# Patient Record
Sex: Female | Born: 1940 | Race: White | Hispanic: No | Marital: Married | Smoking: Never smoker
Health system: Southern US, Community
[De-identification: ages and names within clinical notes are randomized; demographics above are authoritative.]

## PROBLEM LIST (undated history)

## (undated) DIAGNOSIS — I10 Essential (primary) hypertension: Secondary | ICD-10-CM

## (undated) DIAGNOSIS — I499 Cardiac arrhythmia, unspecified: Secondary | ICD-10-CM

## (undated) DIAGNOSIS — E785 Hyperlipidemia, unspecified: Secondary | ICD-10-CM

## (undated) DIAGNOSIS — I509 Heart failure, unspecified: Secondary | ICD-10-CM

## (undated) DIAGNOSIS — I2510 Atherosclerotic heart disease of native coronary artery without angina pectoris: Secondary | ICD-10-CM

## (undated) HISTORY — PX: EYE SURGERY: SHX253

## (undated) HISTORY — PX: COLONOSCOPY: SHX174

## (undated) HISTORY — PX: ATRIAL FIBRILLATION ABLATION: EP1191

## (undated) HISTORY — PX: CHOLECYSTECTOMY: SHX55

---

## 2010-08-12 ENCOUNTER — Emergency Department (HOSPITAL_COMMUNITY)
Admission: EM | Admit: 2010-08-12 | Discharge: 2010-08-12 | Disposition: A | Discharge status: Home / Self Care | Payer: Medicare Other | Attending: Emergency Medicine | Admitting: Emergency Medicine

## 2010-08-12 ENCOUNTER — Emergency Department (HOSPITAL_COMMUNITY): Payer: Medicare Other

## 2010-08-12 DIAGNOSIS — F411 Generalized anxiety disorder: Secondary | ICD-10-CM | POA: Insufficient documentation

## 2010-08-12 DIAGNOSIS — I4891 Unspecified atrial fibrillation: Secondary | ICD-10-CM

## 2010-08-12 DIAGNOSIS — R0609 Other forms of dyspnea: Secondary | ICD-10-CM | POA: Insufficient documentation

## 2010-08-12 DIAGNOSIS — R002 Palpitations: Secondary | ICD-10-CM | POA: Insufficient documentation

## 2010-08-12 DIAGNOSIS — R0602 Shortness of breath: Secondary | ICD-10-CM | POA: Insufficient documentation

## 2010-08-12 DIAGNOSIS — R0989 Other specified symptoms and signs involving the circulatory and respiratory systems: Secondary | ICD-10-CM | POA: Insufficient documentation

## 2010-08-12 DIAGNOSIS — R000 Tachycardia, unspecified: Secondary | ICD-10-CM | POA: Insufficient documentation

## 2010-08-12 LAB — COMPREHENSIVE METABOLIC PANEL
Albumin: 3.7 g/dL (ref 3.5–5.2)
Alkaline Phosphatase: 108 U/L (ref 39–117)
BUN: 16 mg/dL (ref 6–23)
CO2: 26 mEq/L (ref 19–32)
Chloride: 106 mEq/L (ref 96–112)
GFR calc non Af Amer: >60 mL/min (ref >60)
Glucose, Bld: 111 mg/dL — ABNORMAL HIGH (ref 70–99)
Potassium: 3.7 mEq/L (ref 3.5–5.1)
Total Bilirubin: 0.6 mg/dL (ref 0.3–1.2)

## 2010-08-12 LAB — DIFFERENTIAL
Basophils Absolute: 0 10*3/uL (ref 0.0–0.1)
Eosinophils Absolute: 0.1 10*3/uL (ref 0.0–0.7)
Eosinophils Relative: 1 % (ref 0–5)
Lymphocytes Relative: 18 % (ref 12–46)
Neutrophils Relative %: 76 % (ref 43–77)

## 2010-08-12 LAB — CBC
Platelets: 246 10*3/uL (ref 150–400)
RBC: 4.75 MIL/uL (ref 3.87–5.11)
RDW: 13 % (ref 11.5–15.5)
WBC: 8.2 10*3/uL (ref 4.0–10.5)

## 2010-08-12 LAB — PROTIME-INR
INR: 1.02 (ref 0.00–1.49)
Prothrombin Time: 13.6 seconds (ref 11.6–15.2)

## 2010-08-12 LAB — T4, FREE: Free T4: 1.44 ng/dL (ref 0.80–1.80)

## 2010-08-12 LAB — APTT: aPTT: 31 seconds (ref 24–37)

## 2010-08-12 LAB — CK TOTAL AND CKMB (NOT AT ARMC): Relative Index: INVALID (ref 0.0–2.5)

## 2010-08-12 LAB — TSH: TSH: 1.14 u[IU]/mL (ref 0.350–4.500)

## 2010-09-21 NOTE — Consult Note (Signed)
[name], [name]            ACCOUNT NO.:  [id number]  MEDICAL RECORD NO.:  [id number]           PATIENT TYPE:  E  LOCATION:  [facility]                         FACILITY:  [facility]  PHYSICIAN:  Rollene Rotunda, MD, FACCDATE OF BIRTH:  05/07/1940  DATE OF CONSULTATION:  08/12/2010 DATE OF DISCHARGE:                                CONSULTATION   PRIMARY CARDIOLOGIST:  [facility], being seen by Dr. Antoine Blair.  PRIMARY CARE PROVIDER:  Wardell Honour, MD, located at [address] [place], [place], [address], phone number [phone].  PATIENT PROFILE:  A 70 year old female without prior cardiac history, presents to the [facility] ED with AFib and RVR.  PROBLEMS: 1. AFib with RVR. 2. History of palpitations. 3. History of the chest congestion and sinus congestion. 4. Status post cholecystectomy.  ALLERGIES:  No known drug allergies.  HISTORY OF PRESENT ILLNESS:  A 70 year old female without prior cardiac history.  In the early revealed April, she developed chest congestion and sinus congestion, was treated with antibiotics and Mucinex.  She notes that she was not taking any decongestants.  She did have symptomatic improvement, but then approximately 10 days ago noted recurrent chest congestion and also dyspnea on exertion, which was new, and she also says she has occasional palpitations.  She flew from [place], [place] to [place], [place], yesterday to visit her daughter in [place].  Because of recurrent worsening chest congestion, the patient started taking Mucinex again without decongestants.  This morning, secondary to progressive congestion in her chest, she went to a local urgent care where she was found to be in AFib with RVR with rates in the 170s.  She was taken to [facility] ED and treated with diltiazem bolus x2 followed by infusion at 10 mg per hour. This was subsequently increased to 20 mg per hour and the patient was also treated  with Lopressor 5 mg IV x1.  This resulted in reduction in blood pressure into the 80s.  At that point, the patient felt somewhat lightheaded, but also heart rate dropped into the 90s.  The patient received a fluid bolus with improvement in blood pressure and symptoms while heart rate remained in the 90s and subsequently converted spontaneously to sinus rhythm.  The patient is currently asymptomatic.  HOME MEDICATIONS:  None.  FAMILY HISTORY:  Mother died of colon cancer at 26.  Father died of an MI at 14.  Brother recently died of an MI at age 48.  SOCIAL HISTORY:  The patient lives in [place], [place], with her husband.  She works at a [employer].  She denies tobacco or drug use.  She had an occasional glass of wine and drinks 1 cup of coffee in the morning and has a cup of decaf at night.  She avoids caffeinated beverages otherwise.  REVIEW OF SYSTEMS:  Positive for occasional palpitations, though interestingly none today in the setting of a heart rate of 170.  She has been having chest congestion and also has noticed dyspnea on exertion. She is a full code.  Otherwise, all systems reviewed and negative.  PHYSICAL EXAMINATION:  VITAL SIGNS:  She is afebrile, heart rate 71, respirations 18, blood pressure 105/73, pulse ox 92% on room air. GENERAL:  Pleasant white female, in no acute distress, awake, alert and oriented x3.  She has a normal affect. HEENT:  Normal. NEUROLOGIC:  Grossly, nonfocal. SKIN:  Warm and dry without lesions or masses. NECK:  Supple without bruits, JVD. LUNGS:  Respirations are regular and unlabored with bibasilar crackles, otherwise, clear to auscultation. CARDIAC:  Regular S1 and S2.  No S3, S4, or murmurs. ABDOMEN:  Round, soft, nontender, nondistended.  Bowel sounds present x4. EXTREMITIES:  Warm, dry, pink.  No clubbing, cyanosis or edema. Dorsalis pedis and posterior tibial pulses 2+ and equal bilaterally.  Chest x-ray shows  borderline heart size, bilateral perihilar and lower lobe opacities, right greater than left.  Suspect small bilateral effusions.  EKG shows AFib, rate of 175, normal axis, no acute ST-T changes.  Followup EKG shows sinus rhythm with PAC, no acute ST or T changes.  Hemoglobin 14.3, hematocrit 43.2, WBC 8.2, platelets 246. Sodium 143, potassium 3.7, chloride 106, CO2 of 26, BUN 16, creatinine 0.69, glucose 111, total bilirubin 0.6, alkaline phosphatase 108, AST 51, ALT 97, total protein 7.1, albumin 9.5.  CK 66, MB 2.7, troponin-I less than 0.30.  INR 1.02, calcium 9.5.  ASSESSMENT AND PLAN:  Atrial fibrillation with rapid ventricular response.  The patient has now converted to sinus rhythm.  We plan to check a 2-D echocardiogram in the ED as she does have bibasilar crackles and some suggestion of vascular congestion on chest x-ray.  Provided that echo shows normal LV function, in the setting of a CHADS2 score of 0 (CHADS2-VASc of 2), we will plan to discharge the patient from the ED on aspirin 325 mg daily.  We will also give her a prescription for diltiazem 30 mg t.i.d. and recommend followup with her primary care provider in [place].  If echocardiogram shows new reduction in LV function, we will plan to admit the patient for further evaluation.     Nicolasa Ducking, ANP   ______________________________ Rollene Rotunda, MD, [facility]    CB/MEDQ  D:  08/12/2010  T:  08/13/2010  Job:  [id number]  cc:   Ulice Brilliant, MD Lauren Honour, MD  Electronically Signed by Nicolasa Ducking ANP on 08/17/2010 01:14:02 PM Electronically Signed by Rollene Rotunda MD [facility] on 09/21/2010 11:39:45 AM

## 2020-02-26 ENCOUNTER — Emergency Department (HOSPITAL_BASED_OUTPATIENT_CLINIC_OR_DEPARTMENT_OTHER): Payer: Medicare Other

## 2020-02-26 ENCOUNTER — Encounter (HOSPITAL_BASED_OUTPATIENT_CLINIC_OR_DEPARTMENT_OTHER): Payer: Self-pay | Admitting: *Deleted

## 2020-02-26 ENCOUNTER — Other Ambulatory Visit: Payer: Self-pay

## 2020-02-26 ENCOUNTER — Emergency Department (HOSPITAL_BASED_OUTPATIENT_CLINIC_OR_DEPARTMENT_OTHER)
Admission: EM | Admit: 2020-02-26 | Discharge: 2020-02-26 | Disposition: A | Discharge status: Home / Self Care | Payer: Medicare Other | Attending: Emergency Medicine | Admitting: Emergency Medicine

## 2020-02-26 DIAGNOSIS — I2510 Atherosclerotic heart disease of native coronary artery without angina pectoris: Secondary | ICD-10-CM | POA: Insufficient documentation

## 2020-02-26 DIAGNOSIS — S82842A Displaced bimalleolar fracture of left lower leg, initial encounter for closed fracture: Secondary | ICD-10-CM | POA: Diagnosis not present

## 2020-02-26 DIAGNOSIS — W108XXA Fall (on) (from) other stairs and steps, initial encounter: Secondary | ICD-10-CM | POA: Insufficient documentation

## 2020-02-26 DIAGNOSIS — Z7901 Long term (current) use of anticoagulants: Secondary | ICD-10-CM | POA: Insufficient documentation

## 2020-02-26 DIAGNOSIS — Z79899 Other long term (current) drug therapy: Secondary | ICD-10-CM | POA: Diagnosis not present

## 2020-02-26 DIAGNOSIS — R2242 Localized swelling, mass and lump, left lower limb: Secondary | ICD-10-CM | POA: Diagnosis not present

## 2020-02-26 DIAGNOSIS — S99912A Unspecified injury of left ankle, initial encounter: Secondary | ICD-10-CM | POA: Diagnosis present

## 2020-02-26 HISTORY — DX: Atherosclerotic heart disease of native coronary artery without angina pectoris: I25.10

## 2020-02-26 MED ORDER — HYDROCODONE-ACETAMINOPHEN 5-325 MG PO TABS: 1.0000 | ORAL_TABLET | Freq: Four times a day (QID) | ORAL | 0 refills | Status: DC | PRN

## 2020-02-26 NOTE — Discharge Instructions (Signed)
Please follow up with Dr. August Saucer on Monday (call the office first thing to schedule an appointment) to discuss surgery. It is recommended that you stop taking your Xarelto Saturday and Sunday in preparation for surgery Monday.   Do not bare weight onto your foot by any means. Whenever possible please elevate your foot on 2-3 pillows to help reduce swelling/blood pooling specifically given the fact that you are on a blood thinner. You can apply ice as needed. I have prescribed a short course of narcotic pain medication to take as needed however if your pain is tolerable Tylenol will work as well. I have sent the prescription to the pharmacy that [facility] has specified - please pick up tomorrow as they are not open today.   Return to the ED IMMEDIATELY for any worsening symptoms

## 2020-02-26 NOTE — ED Triage Notes (Signed)
Left ankle injury. She fell down steps this am. Swelling and deformity noted. To tx room via w/c.

## 2020-02-26 NOTE — ED Notes (Signed)
ED Provider at bedside. 

## 2020-02-26 NOTE — ED Notes (Signed)
Ice applied. Foot elevated.

## 2020-02-26 NOTE — ED Provider Notes (Signed)
[facility] EMERGENCY DEPARTMENT Provider Note   CSN: [id number] Arrival date & time: 02/26/20  1236     History Chief Complaint  Patient presents with  . Ankle Injury    [name] is a 79 y.o. female with PMHx CAD, and A fib on Xarelto who presents to the ED today with complaint of sudden onset, constant, sharp, left ankle pain secondary to falling down 2 steps in her garage earlier this morning.  Patient was on her way to [place] when she tripped and fell going down 3 steps total in her garage. She cleared the first step when she stumbled and fell; she states the landing at the bottom of the stairs is quite narrow and her left leg went down underneath her. No head injury or LOC. She was able to get up on her own however has not been able to bare weight onto the ankle causing her to have her son bring her to the ED for further evaluation. She took Tylenol PTA with relief of her pain. She states her ankle feels "tight" but otherwise is feeling okay. No other complaints at this time.   The history is provided by the patient and medical records.       Past Medical History:  Diagnosis Date  . Coronary artery disease     There are no problems to display for this patient.   Past Surgical History:  Procedure Laterality Date  . ATRIAL FIBRILLATION ABLATION       OB History   No obstetric history on file.     No family history on file.  Social History   Tobacco Use  . Smoking status: Never Smoker  . Smokeless tobacco: Never Used  Substance Use Topics  . Alcohol use: Yes  . Drug use: Never    Home Medications Prior to Admission medications   Medication Sig Start Date End Date Taking? Authorizing Provider  atorvastatin (LIPITOR) 10 MG tablet Take 10 mg by mouth daily. 12/10/19   [provider]  HYDROcodone-acetaminophen (NORCO/VICODIN) 5-325 MG tablet Take 1 tablet by mouth every 6 (six) hours as needed for severe pain. 02/26/20   Tanda Rockers, PA-C  sotalol (BETAPACE) 80 MG tablet Take 80 mg by mouth 2 (two) times daily. 01/17/20   [provider]  timolol (TIMOPTIC) 0.5 % ophthalmic solution  10/13/19   [provider]  XARELTO 20 MG TABS tablet Take 20 mg by mouth daily. 02/06/20   [provider]    Allergies    Patient has no known allergies.  Review of Systems   Review of Systems  Constitutional: Negative for chills and fever.  Musculoskeletal: Positive for arthralgias.  Skin: Negative for wound.  Neurological: Negative for syncope and headaches.  All other systems reviewed and are negative.   Physical Exam Updated Vital Signs BP 132/70   Pulse 63   Temp 97.7 F (36.5 C) (Oral)   Resp 18   Ht 5\' 4"  (1.626 m)   Wt 90.7 kg   SpO2 98%   BMI 34.33 kg/m   Physical Exam Vitals and nursing note reviewed.  Constitutional:      Appearance: She is not ill-appearing.  HENT:     Head: Normocephalic and atraumatic.     Comments: No raccoon's sign or battle's sign.  Eyes:     Conjunctiva/sclera: Conjunctivae normal.  Cardiovascular:     Rate and Rhythm: Normal rate and regular rhythm.     Pulses: Normal pulses.  Pulmonary:  Effort: Pulmonary effort is normal.     Breath sounds: Normal breath sounds. No wheezing, rhonchi or rales.  Abdominal:     Palpations: Abdomen is soft.     Tenderness: There is no abdominal tenderness.  Musculoskeletal:     Cervical back: Neck supple. No tenderness.     Comments: Pelvis is stable. No C, T, or L midline spinal TTP.   Left ankle with diffuse swelling circumferentially with mild TTP. ROM limited s/2 pain however pt able to flex and extend without much difficulty. No tenderness to the foot or proximal tib/fib. 2+ DP pulse. Cap refill < 2 seconds.   Skin:    General: Skin is warm and dry.  Neurological:     Mental Status: She is alert.     ED Results / Procedures / Treatments   Labs (all labs ordered are listed, but only abnormal  results are displayed) Labs Reviewed - No data to display  EKG None  Radiology DG Ankle Complete Left  Result Date: 02/26/2020 CLINICAL DATA:  Fall down steps EXAM: LEFT ANKLE COMPLETE - 3+ VIEW COMPARISON:  None. FINDINGS: Osteopenia. There is a bimalleolar fracture with an oblique fracture through the distal fibula with mild posterior displacement of the distal fragment by approximately 1 cortex width. There is a mildly displaced medial malleolar fracture with lateral translocation of the distal fragment by approximately 6 mm. Ankle mortise is disrupted with widening of the medial clear space and lateral tilt of the talar dome. There is associated soft tissue edema. IMPRESSION: Unstable bimalleolar fracture. Electronically Signed   By: Meda Klinefelter MD   On: 02/26/2020 13:22    Procedures Procedures (including critical care time)  Medications Ordered in ED Medications - No data to display  ED Course  I have reviewed the triage vital signs and the nursing notes.  Pertinent labs & imaging results that were available during my care of the patient were reviewed by me and considered in my medical decision making (see chart for details).  Clinical Course as of Feb 26 1651  Thu Feb 26, 2020  1449 Stop taking xarelto Saturday.  Well padded sugar tong posterior Splint ABD to both malleli and heal.  Elevate.  Non weight baring.    [MV]    Clinical Course User Index [MV] Tanda Rockers, PA-C   MDM Rules/Calculators/A&P                           79 year old female presenting to the ED today with complaint of sudden onset, sharp, left ankle pain s/2 missing 2 steps in her garage earlier today. No head injury or LOC. Has not been able to bare weight since prompting her to come to the ED. On arrival VSS. She had an xray done prior to being seen which does show an unstable bimalleolar fracture. On exam pt has diffuse swelling to the left ankle joint with mild TTP. She is NVI  throughout. Given the unstable aspect of the fracture will plan to discuss with ortho regarding patient. She is currently on Xarelto - last took dose last night. She last ate or drank around 8 AM this morning.   Discussed case with Dr. Johnell Comings who evaluated xray; recommends outpatient follow up next week with plan for surgery either Monday or Tuesday. Recommends posterior sugar tong splint with ABD padding to bilateral malleoli and heal. Stressed importance of elevation whenever possible and nonweight baring. Pt to stop  Xarelto Saturday night.   Attending physician Dr. Clarene Duke has evaluated patient as well and agrees with plan.   Splint has been applied successfully; evaluated by myself afterwards. Pt able to tolerate crutches well. Will discharge at this time.   This note was prepared using Dragon voice recognition software and may include unintentional dictation errors due to the inherent limitations of voice recognition software.  Final Clinical Impression(s) / ED Diagnoses Final diagnoses:  Closed bimalleolar fracture of left ankle, initial encounter    Rx / DC Orders ED Discharge Orders         Ordered    HYDROcodone-acetaminophen (NORCO/VICODIN) 5-325 MG tablet  Every 6 hours PRN        02/26/20 1540           Discharge Instructions     Please follow up with Dr. August Saucer on Monday (call the office first thing to schedule an appointment) to discuss surgery. It is recommended that you stop taking your Xarelto Saturday and Sunday in preparation for surgery Monday.   Do not bare weight onto your foot by any means. Whenever possible please elevate your foot on 2-3 pillows to help reduce swelling/blood pooling specifically given the fact that you are on a blood thinner. You can apply ice as needed. I have prescribed a short course of narcotic pain medication to take as needed however if your pain is tolerable Tylenol will work as well. I have sent the prescription to the pharmacy that [facility]  has specified - please pick up tomorrow as they are not open today.   Return to the ED IMMEDIATELY for any worsening symptoms       Tanda Rockers, PA-C 02/26/20 1653    Little, Ambrose Finland, MD 02/26/20 [phone]

## 2020-03-01 ENCOUNTER — Ambulatory Visit (INDEPENDENT_AMBULATORY_CARE_PROVIDER_SITE_OTHER): Payer: Medicare Other

## 2020-03-01 ENCOUNTER — Ambulatory Visit (INDEPENDENT_AMBULATORY_CARE_PROVIDER_SITE_OTHER): Payer: Medicare Other | Admitting: Orthopedic Surgery

## 2020-03-01 DIAGNOSIS — M25572 Pain in left ankle and joints of left foot: Secondary | ICD-10-CM

## 2020-03-03 ENCOUNTER — Telehealth: Payer: Self-pay

## 2020-03-03 NOTE — Telephone Encounter (Addendum)
Patient called stating that she has a black and blue bruise on the back of her left leg below her knee where the cast is.  Would like to know if this is normal?  Patient is on a blood thinner.  Cb# [phone].  Please advise.  Thank you.

## 2020-03-04 ENCOUNTER — Encounter: Payer: Self-pay | Admitting: Orthopedic Surgery

## 2020-03-04 NOTE — Telephone Encounter (Signed)
Can either of you please advise?  Patient seen Monday-has fracture blisters and was to follow up in 1 week Thanks.

## 2020-03-04 NOTE — Telephone Encounter (Signed)
Pt called stating nobody has returned her phone calls she also sent a Mychart message with a picture attached.  She would like to be contacted before the weekend.

## 2020-03-05 NOTE — Telephone Encounter (Signed)
Yes this is expected

## 2020-03-05 NOTE — Telephone Encounter (Signed)
Dr August Saucer spoke with patient/family.

## 2020-03-07 ENCOUNTER — Encounter: Payer: Self-pay | Admitting: Orthopedic Surgery

## 2020-03-07 NOTE — Progress Notes (Signed)
Office Visit Note   Patient: [name]           Date of Birth: [birth year]           MRN: [id number] Visit Date: 03/01/2020 Requested by: No referring provider defined for this encounter. PCP: System, Provider Not In  Subjective: Chief Complaint  Patient presents with  . Left Ankle - Injury    HPI: [name] is a 79 year old active female with left ankle pain.  She had a fall 2011 2521.  Had bimalleolar ankle fracture which was splinted.  She is on Xarelto.  Last dose was 48 hours ago.  She lives in [place] but she staying with her son for the holiday.  Denies any other orthopedic complaints.  Takes Xarelto for atrial fibrillation.              ROS: All systems reviewed are negative as they relate to the chief complaint within the history of present illness.  Patient denies  fevers or chills.   Assessment & Plan: Visit Diagnoses:  1. Pain in left ankle and joints of left foot     Plan: Impression is left ankle fracture with fracture blisters.  This fracture blisters are decompressed today.  Primarily on the lateral side but there is a little bit of skin abrasion on the medial side.  New splint is applied today with a little bit more of a medial directed force and mold on the cast to decompress that medial side.  Not really ready for surgery yet based on presence of these fracture blisters.  May be 1 to 2 weeks before were able to do this case.  New splint applied today.  Alignment remains reasonable.  Follow-up in 7 days for repeat evaluation of the skin.  Follow-Up Instructions: Return in about 1 week (around 03/08/2020).   Orders:  Orders Placed This Encounter  Procedures  . XR Ankle 2 Views Left   No orders of the defined types were placed in this encounter.     Procedures: No procedures performed   Clinical Data: No additional findings.  Objective: Vital Signs: There were no vitals taken for this visit.  Physical Exam:   Constitutional: Patient appears  well-developed HEENT:  Head: Normocephalic Eyes:EOM are normal Neck: Normal range of motion Cardiovascular: Normal rate Pulmonary/chest: Effort normal Neurologic: Patient is alert Skin: Skin is warm Psychiatric: Patient has normal mood and affect    Ortho Exam: Ortho exam demonstrates full active and passive range of motion of the knee.  Fracture blisters are present x3 over the lateral aspect of the lateral malleolus.  These are in line with a direct lateral approach to the fibula.  There is also some abrasion medially but no discrete fracture blisters around the medial malleolus.  Expected amount of swelling is present.  Pedal pulses palpable.  No calf tenderness.  Specialty Comments:  No specialty comments available.  Imaging: No results found.   PMFS History: There are no problems to display for this patient.  Past Medical History:  Diagnosis Date  . Coronary artery disease     No family history on file.  Past Surgical History:  Procedure Laterality Date  . ATRIAL FIBRILLATION ABLATION     Social History   Occupational History  . Not on file  Tobacco Use  . Smoking status: Never Smoker  . Smokeless tobacco: Never Used  Substance and Sexual Activity  . Alcohol use: Yes  . Drug use: Never  . Sexual activity: Not  on file

## 2020-03-08 ENCOUNTER — Ambulatory Visit (INDEPENDENT_AMBULATORY_CARE_PROVIDER_SITE_OTHER): Payer: Medicare Other | Admitting: Orthopedic Surgery

## 2020-03-08 ENCOUNTER — Other Ambulatory Visit: Payer: Self-pay

## 2020-03-08 VITALS — Ht 64.0 in | Wt 200.0 lb

## 2020-03-08 DIAGNOSIS — S82892G Other fracture of left lower leg, subsequent encounter for closed fracture with delayed healing: Secondary | ICD-10-CM | POA: Diagnosis not present

## 2020-03-10 ENCOUNTER — Other Ambulatory Visit: Payer: Self-pay

## 2020-03-12 ENCOUNTER — Encounter: Payer: Self-pay | Admitting: Orthopedic Surgery

## 2020-03-12 ENCOUNTER — Other Ambulatory Visit (HOSPITAL_COMMUNITY)
Admission: RE | Admit: 2020-03-12 | Discharge: 2020-03-12 | Disposition: A | Discharge status: Home / Self Care | Payer: Medicare Other | Source: Ambulatory Visit | Attending: Orthopedic Surgery | Admitting: Orthopedic Surgery

## 2020-03-12 DIAGNOSIS — Z01812 Encounter for preprocedural laboratory examination: Secondary | ICD-10-CM | POA: Insufficient documentation

## 2020-03-12 DIAGNOSIS — Z20822 Contact with and (suspected) exposure to covid-19: Secondary | ICD-10-CM | POA: Insufficient documentation

## 2020-03-12 LAB — SARS CORONAVIRUS 2 (TAT 6-24 HRS): SARS Coronavirus 2: NEGATIVE

## 2020-03-12 NOTE — Progress Notes (Signed)
Office Visit Note   Patient: [name]           Date of Birth: 04-30-1940           MRN: [id number] Visit Date: 03/08/2020 Requested by: No referring provider defined for this encounter. PCP: System, Provider Not In  Subjective: Chief Complaint  Patient presents with  . Left Ankle - Fracture, Follow-up    DOI 02/26/2020    HPI: [name] is a 79 year old patient with left ankle fracture.  Had fracture blisters.  Splint is removed today.  Fracture blisters are improving.  She is on Xarelto.  She require surgery for unstable bimalleolar ankle fracture.              ROS: All systems reviewed are negative as they relate to the chief complaint within the history of present illness.  Patient denies  fevers or chills.   Assessment & Plan: Visit Diagnoses:  1. Closed fracture of left ankle with delayed healing, subsequent encounter     Plan: Impression is closed ankle fracture.  Splint removed today.  Fracture blister healing is progressing.  I think should be ready in approximately 1 week for fixation.  We may have to go a little bit more posterior on the ankle fracture than usual.  Medial side should be fixable with 1 or 2 screws.  We are going to have her hold her Xarelto last dose Saturday night.  Plan for surgery on Tuesday.  Risk and benefits are discussed include not limited to infection nerve vessel damage ankle stiffness as well as deep vein thrombosis stroke heart attack and other perioperative events are also possible.  All questions answered.  Follow-Up Instructions: No follow-ups on file.   Orders:  No orders of the defined types were placed in this encounter.  No orders of the defined types were placed in this encounter.     Procedures: No procedures performed   Clinical Data: No additional findings.  Objective: Vital Signs: Ht 5\' 4"  (1.626 m)   Wt 200 lb (90.7 kg)   BMI 34.33 kg/m   Physical Exam:   Constitutional: Patient appears  well-developed HEENT:  Head: Normocephalic Eyes:EOM are normal Neck: Normal range of motion Cardiovascular: Normal rate Pulmonary/chest: Effort normal Neurologic: Patient is alert Skin: Skin is warm Psychiatric: Patient has normal mood and affect    Ortho Exam: Ortho exam on the medial side demonstrates skin that does not have a fracture blister.  There is some evidence of pressure from the fracture but no skin breakdown.  On the lateral side patient does have fracture blister which has been decompressed and is epithelializing.  This is about 2 fingerbreadths above the metaphysis of the fibular head.  There is room posterior to this fracture blister for approach to the ankle with possible posterior plating.  Pedal pulses palpable.  Negative Homans.  No calf tenderness today.  Specialty Comments:  No specialty comments available.  Imaging: No results found.   PMFS History: There are no problems to display for this patient.  Past Medical History:  Diagnosis Date  . Coronary artery disease     History reviewed. No pertinent family history.  Past Surgical History:  Procedure Laterality Date  . ATRIAL FIBRILLATION ABLATION     Social History   Occupational History  . Not on file  Tobacco Use  . Smoking status: Never Smoker  . Smokeless tobacco: Never Used  Substance and Sexual Activity  . Alcohol use: Yes  . Drug use:  Never  . Sexual activity: Not on file

## 2020-03-15 ENCOUNTER — Encounter (HOSPITAL_COMMUNITY): Payer: Self-pay | Admitting: Orthopedic Surgery

## 2020-03-15 NOTE — Anesthesia Preprocedure Evaluation (Addendum)
Anesthesia Evaluation  Patient identified by MRN, date of birth, ID band Patient awake    Reviewed: Allergy & Precautions, NPO status , Patient's Chart, lab work & pertinent test results  Airway Mallampati: I  TM Distance: >3 FB Neck ROM: Full    Dental   Pulmonary    Pulmonary exam normal        Cardiovascular hypertension, Pt. on medications + CAD  Normal cardiovascular exam+ dysrhythmias Atrial Fibrillation      Neuro/Psych    GI/Hepatic   Endo/Other    Renal/GU      Musculoskeletal   Abdominal   Peds  Hematology   Anesthesia Other Findings   Reproductive/Obstetrics                             Anesthesia Physical Anesthesia Plan  ASA: III  Anesthesia Plan: General   Post-op Pain Management:  Regional for Post-op pain   Induction: Intravenous  PONV Risk Score and Plan:   Airway Management Planned: LMA  Additional Equipment:   Intra-op Plan:   Post-operative Plan: Extubation in OR  Informed Consent: I have reviewed the patients History and Physical, chart, labs and discussed the procedure including the risks, benefits and alternatives for the proposed anesthesia with the patient or authorized representative who has indicated his/her understanding and acceptance.       Plan Discussed with: CRNA and Surgeon  Anesthesia Plan Comments: (PAT note written 03/15/2020 by Shonna Chock, PA-C. )       Anesthesia Quick Evaluation

## 2020-03-15 NOTE — Progress Notes (Addendum)
Anesthesia Chart Review: [name]   Case: [id number] Date/Time: 03/16/20 1222   Procedure: OPEN REDUCTION INTERNAL FIXATION (ORIF) LEFT ANKLE FRACTURE (Left Ankle)   Anesthesia type: General   Pre-op diagnosis: left ankle fracture   [facility]: [facility] ROOM 06 / [facility]   Surgeons: Cammy Copa, MD      DISCUSSION: Patient is a 79 year old female scheduled for the above procedure.  She fell on 02/26/2020 and sustained a left ankle fracture (unstable bimalleolar ankle fracture). Address is [place],  [place]. Reportedly, had been visiting relatives for Thanksgiving.   History includes never smoker, CAD, atrial fibrillation (s/p ablation 10/15/15), HLD.   Cardiologist is Ladora Daniel, MD, [facility] in  [place], [place].  Last visit 02/06/2020.  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  He notes EKG then showed atrial fibrillation with poor R wave progression and nonspecific ST and T wave changes.  It does not appear that any new cardiac testing was ordered.  Last echo January 2021 outlined below.  107-month follow-up planned. He lists PCP as Dr. Ileana Roup.  By notes, Xarelto held after 03/13/20 PM dose. She is a same day work-up, so anesthesia team to evaluate on the day of surgery with labs and EKG as indicated.  03/12/2020 presurgical COVID-19 test negative.   VS: Ht 5\' 4"  (1.626 m)   Wt 88.5 kg   BMI 33.47 kg/m   BP Readings from Last 3 Encounters:  02/26/20 (!) 165/82   Pulse Readings from Last 3 Encounters:  02/26/20 68     LABS: For day of surgery.   EKG: For day of surgery, if more recent tracing not received.    CV: Echo 05/01/2019 ():  Conclusions: 1.  Overall left ventricular systolic function is low normal with EF between 50 to 55%. 2.  Diastolic filling pattern indicates impaired relaxation. 3.  Grade 1 diastolic dysfunction. 4.   CO: 6.05 L/min 5.  The left atrium is moderately dilated. 6.  LAESVI greater than 34 mL/m 7.  There is mild aortic valve sclerosis. 8.  Mild mitral regurgitation. 9.  Mild tricuspid regurgitation.  RVSP is normal at less than 35 mmHg. 10.  Sinus rhythm  "Cardionet: 2020, pt continues to have PAF episodes short"  Past Medical History:  Diagnosis Date  . Coronary artery disease   . Dysrhythmia    a-fib  . HLD (hyperlipidemia)     Past Surgical History:  Procedure Laterality Date  . ATRIAL FIBRILLATION ABLATION    . CHOLECYSTECTOMY    . COLONOSCOPY    . EYE SURGERY Bilateral    cataractions removed    MEDICATIONS: No current facility-administered medications for this encounter.   05/03/2019 acetaminophen (TYLENOL) 500 MG tablet  . atorvastatin (LIPITOR) 10 MG tablet  . calcium carbonate (TUMS - DOSED IN MG ELEMENTAL CALCIUM) 500 MG chewable tablet  . sotalol (BETAPACE) 80 MG tablet  . HYDROcodone-acetaminophen (NORCO/VICODIN) 5-325 MG tablet  . Rivaroxaban (XARELTO) 15 MG TABS tablet  . XARELTO 20 MG TABS tablet    Marland Kitchen, PA-C Surgical Short Stay/Anesthesiology [facility] Phone [phone] [facility] Phone [phone] 03/15/2020 3:25 PM

## 2020-03-15 NOTE — Progress Notes (Signed)
Patient denies shortness of breath, fever, cough or chest pain.  PCP - None Cardiologist - Dr Minus Liberty in  [facility]  Chest x-ray - n/a EKG - DOS 03/16/20 Stress Test - n/a ECHO - n/a Cardiac Cath - n/a  Blood Thinner Instructions:  Last Dose of eliquis was on 03/13/20.  ERAS: Clears til 0930 DOS.  Anesthesia review: Yes  STOP now taking any Aspirin (unless otherwise instructed by your surgeon), Aleve, Naproxen, Ibuprofen, Motrin, Advil, Goody's, BC's, all herbal medications, fish oil, and all vitamins.   Coronavirus Screening Covid test on 03/12/20 was negative.  Patient verbalized understanding of instructions that were given via phone.

## 2020-03-16 ENCOUNTER — Other Ambulatory Visit: Payer: Self-pay

## 2020-03-16 ENCOUNTER — Encounter (HOSPITAL_COMMUNITY)
Admission: RE | Disposition: A | Discharge status: Home / Self Care | Payer: Self-pay | Source: Home / Self Care | Attending: Orthopedic Surgery

## 2020-03-16 ENCOUNTER — Ambulatory Visit (HOSPITAL_COMMUNITY): Payer: Medicare Other | Admitting: Physician Assistant

## 2020-03-16 ENCOUNTER — Encounter (HOSPITAL_COMMUNITY): Payer: Self-pay | Admitting: Orthopedic Surgery

## 2020-03-16 ENCOUNTER — Ambulatory Visit (HOSPITAL_COMMUNITY): Payer: Medicare Other

## 2020-03-16 ENCOUNTER — Ambulatory Visit (HOSPITAL_COMMUNITY)
Admission: RE | Admit: 2020-03-16 | Discharge: 2020-03-16 | Disposition: A | Discharge status: Home / Self Care | Payer: Medicare Other | Attending: Orthopedic Surgery | Admitting: Orthopedic Surgery

## 2020-03-16 DIAGNOSIS — I4891 Unspecified atrial fibrillation: Secondary | ICD-10-CM | POA: Insufficient documentation

## 2020-03-16 DIAGNOSIS — Z79899 Other long term (current) drug therapy: Secondary | ICD-10-CM | POA: Diagnosis not present

## 2020-03-16 DIAGNOSIS — Z7901 Long term (current) use of anticoagulants: Secondary | ICD-10-CM | POA: Diagnosis not present

## 2020-03-16 DIAGNOSIS — S82842G Displaced bimalleolar fracture of left lower leg, subsequent encounter for closed fracture with delayed healing: Secondary | ICD-10-CM | POA: Diagnosis not present

## 2020-03-16 DIAGNOSIS — S82842A Displaced bimalleolar fracture of left lower leg, initial encounter for closed fracture: Secondary | ICD-10-CM | POA: Insufficient documentation

## 2020-03-16 DIAGNOSIS — Z419 Encounter for procedure for purposes other than remedying health state, unspecified: Secondary | ICD-10-CM

## 2020-03-16 DIAGNOSIS — X58XXXA Exposure to other specified factors, initial encounter: Secondary | ICD-10-CM | POA: Insufficient documentation

## 2020-03-16 HISTORY — DX: Essential (primary) hypertension: I10

## 2020-03-16 HISTORY — PX: ORIF ANKLE FRACTURE: SHX5408

## 2020-03-16 HISTORY — DX: Hyperlipidemia, unspecified: E78.5

## 2020-03-16 HISTORY — DX: Cardiac arrhythmia, unspecified: I49.9

## 2020-03-16 HISTORY — DX: Heart failure, unspecified: I50.9

## 2020-03-16 LAB — BASIC METABOLIC PANEL
Anion gap: 14 (ref 5–15)
BUN: 14 mg/dL (ref 8–23)
CO2: 24 mmol/L (ref 22–32)
Calcium: 9.9 mg/dL (ref 8.9–10.3)
Chloride: 104 mmol/L (ref 98–111)
Creatinine, Ser: 0.81 mg/dL (ref 0.44–1.00)
GFR, Estimated: >60 mL/min (ref >60)
Glucose, Bld: 107 mg/dL — ABNORMAL HIGH (ref 70–99)
Potassium: 3.8 mmol/L (ref 3.5–5.1)
Sodium: 142 mmol/L (ref 135–145)

## 2020-03-16 LAB — CBC
HCT: 42.8 % (ref 36.0–46.0)
Hemoglobin: 13.8 g/dL (ref 12.0–15.0)
MCH: 30.7 pg (ref 26.0–34.0)
MCHC: 32.2 g/dL (ref 30.0–36.0)
MCV: 95.1 fL (ref 80.0–100.0)
Platelets: 309 10*3/uL (ref 150–400)
RBC: 4.5 MIL/uL (ref 3.87–5.11)
RDW: 12.8 % (ref 11.5–15.5)
WBC: 7.5 10*3/uL (ref 4.0–10.5)
nRBC: 0 % (ref 0.0–0.2)

## 2020-03-16 SURGERY — OPEN REDUCTION INTERNAL FIXATION (ORIF) ANKLE FRACTURE
Anesthesia: General | Site: Ankle | Laterality: Left

## 2020-03-16 MED ORDER — BUPIVACAINE HCL (PF) 0.25 % IJ SOLN
INTRAMUSCULAR | Status: AC
  Filled 2020-03-16: qty 30

## 2020-03-16 MED ORDER — DEXAMETHASONE SODIUM PHOSPHATE 10 MG/ML IJ SOLN
INTRAMUSCULAR | Status: DC | PRN
  Administered 2020-03-16: 5 mg via INTRAVENOUS

## 2020-03-16 MED ORDER — VANCOMYCIN HCL 1000 MG IV SOLR
INTRAVENOUS | Status: DC | PRN
  Administered 2020-03-16: 1000 mg

## 2020-03-16 MED ORDER — PHENYLEPHRINE HCL-NACL 10-0.9 MG/250ML-% IV SOLN
INTRAVENOUS | Status: DC | PRN
  Administered 2020-03-16: 40 ug/min via INTRAVENOUS

## 2020-03-16 MED ORDER — PHENYLEPHRINE 40 MCG/ML (10ML) SYRINGE FOR IV PUSH (FOR BLOOD PRESSURE SUPPORT)
PREFILLED_SYRINGE | INTRAVENOUS | Status: DC | PRN
  Administered 2020-03-16: 80 ug via INTRAVENOUS

## 2020-03-16 MED ORDER — MEPERIDINE HCL 25 MG/ML IJ SOLN
6.2500 mg | INTRAMUSCULAR | Status: DC | PRN
Start: 2020-03-16 — End: 2020-03-16

## 2020-03-16 MED ORDER — ONDANSETRON HCL 4 MG/2ML IJ SOLN: 4.0000 mg | Freq: Once | INTRAMUSCULAR | Status: DC | PRN

## 2020-03-16 MED ORDER — LACTATED RINGERS IV SOLN: INTRAVENOUS | Status: DC

## 2020-03-16 MED ORDER — VANCOMYCIN HCL 1000 MG IV SOLR
INTRAVENOUS | Status: AC
  Filled 2020-03-16: qty 1000

## 2020-03-16 MED ORDER — CEFAZOLIN SODIUM-DEXTROSE 2-4 GM/100ML-% IV SOLN
2.0000 g | INTRAVENOUS | Status: AC
  Administered 2020-03-16: 14:00:00 2 g via INTRAVENOUS
  Filled 2020-03-16: qty 100

## 2020-03-16 MED ORDER — LIDOCAINE 2% (20 MG/ML) 5 ML SYRINGE
INTRAMUSCULAR | Status: DC | PRN
  Administered 2020-03-16: 60 mg via INTRAVENOUS

## 2020-03-16 MED ORDER — PHENYLEPHRINE 40 MCG/ML (10ML) SYRINGE FOR IV PUSH (FOR BLOOD PRESSURE SUPPORT)
PREFILLED_SYRINGE | INTRAVENOUS | Status: AC
  Filled 2020-03-16: qty 10

## 2020-03-16 MED ORDER — FENTANYL CITRATE (PF) 100 MCG/2ML IJ SOLN
50.0000 ug | Freq: Once | INTRAMUSCULAR | Status: AC
Start: 2020-03-16 — End: 2020-03-16

## 2020-03-16 MED ORDER — FENTANYL CITRATE (PF) 250 MCG/5ML IJ SOLN
INTRAMUSCULAR | Status: AC
  Filled 2020-03-16: qty 5

## 2020-03-16 MED ORDER — HYDROMORPHONE HCL 1 MG/ML IJ SOLN: 0.2500 mg | INTRAMUSCULAR | Status: DC | PRN

## 2020-03-16 MED ORDER — PROPOFOL 10 MG/ML IV BOLUS
INTRAVENOUS | Status: AC
  Filled 2020-03-16: qty 20

## 2020-03-16 MED ORDER — CHLORHEXIDINE GLUCONATE 0.12 % MT SOLN: 15.0000 mL | Freq: Once | OROMUCOSAL | Status: AC

## 2020-03-16 MED ORDER — MIDAZOLAM HCL 2 MG/2ML IJ SOLN
INTRAMUSCULAR | Status: AC
  Administered 2020-03-16: 13:00:00 1 mg via INTRAVENOUS
  Filled 2020-03-16: qty 2

## 2020-03-16 MED ORDER — PROPOFOL 10 MG/ML IV BOLUS
INTRAVENOUS | Status: DC | PRN
  Administered 2020-03-16: 140 mg via INTRAVENOUS

## 2020-03-16 MED ORDER — POVIDONE-IODINE 10 % EX SWAB: 2.0000 "application " | Freq: Once | CUTANEOUS | Status: DC

## 2020-03-16 MED ORDER — DEXAMETHASONE SODIUM PHOSPHATE 10 MG/ML IJ SOLN
INTRAMUSCULAR | Status: AC
  Filled 2020-03-16: qty 1

## 2020-03-16 MED ORDER — HYDROMORPHONE HCL 1 MG/ML IJ SOLN
INTRAMUSCULAR | Status: AC
  Filled 2020-03-16: qty 1

## 2020-03-16 MED ORDER — 0.9 % SODIUM CHLORIDE (POUR BTL) OPTIME
TOPICAL | Status: DC | PRN
Start: 2020-03-16 — End: 2020-03-16
  Administered 2020-03-16 (×4): 1000 mL

## 2020-03-16 MED ORDER — ONDANSETRON HCL 4 MG/2ML IJ SOLN
INTRAMUSCULAR | Status: DC | PRN
  Administered 2020-03-16: 4 mg via INTRAVENOUS

## 2020-03-16 MED ORDER — CHLORHEXIDINE GLUCONATE 0.12 % MT SOLN
OROMUCOSAL | Status: AC
  Administered 2020-03-16: 11:00:00 15 mL via OROMUCOSAL
  Filled 2020-03-16: qty 15

## 2020-03-16 MED ORDER — FENTANYL CITRATE (PF) 100 MCG/2ML IJ SOLN
INTRAMUSCULAR | Status: DC | PRN
  Administered 2020-03-16: 25 ug via INTRAVENOUS
  Administered 2020-03-16: 50 ug via INTRAVENOUS

## 2020-03-16 MED ORDER — METHOCARBAMOL 500 MG PO TABS: 500.0000 mg | ORAL_TABLET | Freq: Three times a day (TID) | ORAL | 0 refills | Status: AC | PRN

## 2020-03-16 MED ORDER — FENTANYL CITRATE (PF) 100 MCG/2ML IJ SOLN
INTRAMUSCULAR | Status: AC
  Administered 2020-03-16: 13:00:00 50 ug via INTRAVENOUS
  Filled 2020-03-16: qty 2

## 2020-03-16 MED ORDER — POVIDONE-IODINE 7.5 % EX SOLN
Freq: Once | CUTANEOUS | Status: DC
  Filled 2020-03-16: qty 118

## 2020-03-16 MED ORDER — ONDANSETRON HCL 4 MG/2ML IJ SOLN
INTRAMUSCULAR | Status: AC
  Filled 2020-03-16: qty 2

## 2020-03-16 MED ORDER — MIDAZOLAM HCL 2 MG/2ML IJ SOLN: 1.0000 mg | Freq: Once | INTRAMUSCULAR | Status: AC

## 2020-03-16 MED ORDER — ROCURONIUM BROMIDE 10 MG/ML (PF) SYRINGE
PREFILLED_SYRINGE | INTRAVENOUS | Status: AC
  Filled 2020-03-16: qty 10

## 2020-03-16 MED ORDER — HYDROCODONE-ACETAMINOPHEN 5-325 MG PO TABS: 1.0000 | ORAL_TABLET | ORAL | 0 refills | Status: DC | PRN

## 2020-03-16 MED ORDER — SUGAMMADEX SODIUM 200 MG/2ML IV SOLN
INTRAVENOUS | Status: DC | PRN
  Administered 2020-03-16: 200 mg via INTRAVENOUS

## 2020-03-16 MED ORDER — ORAL CARE MOUTH RINSE: 15.0000 mL | Freq: Once | OROMUCOSAL | Status: AC

## 2020-03-16 MED ORDER — BUPIVACAINE HCL (PF) 0.25 % IJ SOLN
INTRAMUSCULAR | Status: DC | PRN
  Administered 2020-03-16: 20 mL

## 2020-03-16 MED ORDER — LIDOCAINE 2% (20 MG/ML) 5 ML SYRINGE
INTRAMUSCULAR | Status: AC
  Filled 2020-03-16: qty 5

## 2020-03-16 MED ORDER — ROCURONIUM BROMIDE 10 MG/ML (PF) SYRINGE
PREFILLED_SYRINGE | INTRAVENOUS | Status: DC | PRN
  Administered 2020-03-16: 60 mg via INTRAVENOUS

## 2020-03-16 SURGICAL SUPPLY — 97 items
BENZOIN TINCTURE PRP APPL 2/3 (GAUZE/BANDAGES/DRESSINGS) ×6 IMPLANT
BIT DRILL 2.7 QC CANN 155 (BIT) ×2 IMPLANT
BIT DRILL 2.7 QC CANN 155MM (BIT) ×1
BIT DRILL OVR 3.5AO QC SHRT SM (DRILL) ×1 IMPLANT
BIT DRILL QC 2.0 SHORT EVOS SM (DRILL) ×1 IMPLANT
BIT DRILL QC 2.5MM SHRT EVO SM (DRILL) ×1 IMPLANT
BLADE SURG 10 STRL SS (BLADE) IMPLANT
BNDG COHESIVE 6X5 TAN STRL LF (GAUZE/BANDAGES/DRESSINGS) IMPLANT
BNDG ELASTIC 3X5.8 VLCR STR LF (GAUZE/BANDAGES/DRESSINGS) ×3 IMPLANT
BNDG ELASTIC 4X5.8 VLCR STR LF (GAUZE/BANDAGES/DRESSINGS) ×3 IMPLANT
BNDG ELASTIC 6X10 VLCR STRL LF (GAUZE/BANDAGES/DRESSINGS) ×3 IMPLANT
BNDG ESMARK 4X9 LF (GAUZE/BANDAGES/DRESSINGS) ×3 IMPLANT
BNDG GAUZE ELAST 4 BULKY (GAUZE/BANDAGES/DRESSINGS) ×3 IMPLANT
CONNECTOR Y ATS VAC SYSTEM (MISCELLANEOUS) ×3 IMPLANT
COVER MAYO STAND STRL (DRAPES) IMPLANT
COVER SURGICAL LIGHT HANDLE (MISCELLANEOUS) ×3 IMPLANT
COVER WAND RF STERILE (DRAPES) IMPLANT
CUFF TOURN SGL QUICK 34 (TOURNIQUET CUFF)
CUFF TOURN SGL QUICK 42 (TOURNIQUET CUFF) IMPLANT
CUFF TRNQT CYL 34X4.125X (TOURNIQUET CUFF) IMPLANT
DRAPE C-ARM 42X72 X-RAY (DRAPES) ×3 IMPLANT
DRAPE INCISE IOBAN 66X45 STRL (DRAPES) IMPLANT
DRAPE SURG 17X23 STRL (DRAPES) ×3 IMPLANT
DRAPE U-SHAPE 47X51 STRL (DRAPES) ×3 IMPLANT
DRILL OVER 3.5 AO QC SHORT SM (DRILL) ×3
DRILL QC 2.0 SHORT EVOS SM (DRILL) ×3
DRILL QC 2.5MM SHORT EVOS SM (DRILL) ×3
DRSG PAD ABDOMINAL 8X10 ST (GAUZE/BANDAGES/DRESSINGS) ×3 IMPLANT
DRSG XEROFORM 1X8 (GAUZE/BANDAGES/DRESSINGS) ×3 IMPLANT
DURAPREP 26ML APPLICATOR (WOUND CARE) IMPLANT
ELECT CAUTERY BLADE 6.4 (BLADE) ×3 IMPLANT
ELECT REM PT RETURN 9FT ADLT (ELECTROSURGICAL) ×3
ELECTRODE REM PT RTRN 9FT ADLT (ELECTROSURGICAL) ×1 IMPLANT
GAUZE SPONGE 4X4 12PLY STRL (GAUZE/BANDAGES/DRESSINGS) ×3 IMPLANT
GAUZE XEROFORM 5X9 LF (GAUZE/BANDAGES/DRESSINGS) ×3 IMPLANT
GLOVE BIOGEL PI IND STRL 7.0 (GLOVE) ×1 IMPLANT
GLOVE BIOGEL PI IND STRL 8 (GLOVE) ×1 IMPLANT
GLOVE BIOGEL PI INDICATOR 7.0 (GLOVE) ×2
GLOVE BIOGEL PI INDICATOR 8 (GLOVE) ×2
GLOVE ECLIPSE 7.0 STRL STRAW (GLOVE) ×3 IMPLANT
GLOVE ECLIPSE 8.0 STRL XLNG CF (GLOVE) ×3 IMPLANT
GOWN STRL REUS W/ TWL LRG LVL3 (GOWN DISPOSABLE) ×3 IMPLANT
GOWN STRL REUS W/TWL LRG LVL3 (GOWN DISPOSABLE) ×6
GUIDE PIN 1.3 (PIN) ×6
HANDPIECE INTERPULSE COAX TIP (DISPOSABLE)
KIT BASIN OR (CUSTOM PROCEDURE TRAY) ×3 IMPLANT
KIT PREVENA INCISION MGT 13 (CANNISTER) ×9 IMPLANT
KIT PUMP PREVENA PLUS 14DAY (MISCELLANEOUS) ×3 IMPLANT
KIT TURNOVER KIT B (KITS) ×3 IMPLANT
MANIFOLD NEPTUNE II (INSTRUMENTS) ×3 IMPLANT
NEEDLE 18GX1X1/2 (RX/OR ONLY) (NEEDLE) ×6 IMPLANT
NEEDLE HYPO 25GX1X1/2 BEV (NEEDLE) ×3 IMPLANT
NS IRRIG 1000ML POUR BTL (IV SOLUTION) ×3 IMPLANT
PACK ORTHO EXTREMITY (CUSTOM PROCEDURE TRAY) ×3 IMPLANT
PAD ABD 8X10 STRL (GAUZE/BANDAGES/DRESSINGS) ×15 IMPLANT
PAD ARMBOARD 7.5X6 YLW CONV (MISCELLANEOUS) ×6 IMPLANT
PAD CAST 3X4 CTTN HI CHSV (CAST SUPPLIES) ×1 IMPLANT
PAD CAST 4YDX4 CTTN HI CHSV (CAST SUPPLIES) ×1 IMPLANT
PADDING CAST COTTON 3X4 STRL (CAST SUPPLIES) ×2
PADDING CAST COTTON 4X4 STRL (CAST SUPPLIES) ×2
PIN GUIDE 1.3 (PIN) ×2 IMPLANT
PLATE L.DISTAL 2.7/3.5 7H EVOS (Plate) ×3 IMPLANT
SCREW CANN PT ST 4X65 (Screw) ×6 IMPLANT
SCREW CORT 2.7X12 EVOS (Screw) ×1 IMPLANT
SCREW CORT 2.7X14 T8 EVOS (Screw) ×3 IMPLANT
SCREW CORT 2.7X17 T8 ST EVOS (Screw) ×6 IMPLANT
SCREW CORT 2.7X19 ST STAR EVOS (Screw) ×3 IMPLANT
SCREW CORT 2.7X22 T8 ST EVOS (Screw) ×3 IMPLANT
SCREW CORT 3.5X10MM ST EVOS (Screw) ×6 IMPLANT
SCREW CORT 3.5X14 ST EVOS (Screw) ×3 IMPLANT
SCREW CORT 3.5X30 ST EVOS (Screw) ×3 IMPLANT
SCREW CORT EVOS ST 3.5X12 (Screw) ×9 IMPLANT
SCREW CORT EVOS ST T8 2.7X14MM (Screw) ×2 IMPLANT
SCREW EVOS 2.7X18 LOCK T8 (Screw) ×3 IMPLANT
SCREW LOCK 2.7X13 ST EVOS (Screw) ×3 IMPLANT
SET HNDPC FAN SPRY TIP SCT (DISPOSABLE) IMPLANT
SPLINT PLASTER CAST XFAST 5X30 (CAST SUPPLIES) ×1 IMPLANT
SPLINT PLASTER XFAST SET 5X30 (CAST SUPPLIES) ×2
STOCKINETTE IMPERVIOUS 9X36 MD (GAUZE/BANDAGES/DRESSINGS) IMPLANT
SUCTION FRAZIER HANDLE 10FR (MISCELLANEOUS) ×2
SUCTION TUBE FRAZIER 10FR DISP (MISCELLANEOUS) ×1 IMPLANT
SUT ETHILON 3 0 PS 1 (SUTURE) ×15 IMPLANT
SUT MNCRL AB 3-0 PS2 18 (SUTURE) IMPLANT
SUT VIC AB 2-0 CT1 27 (SUTURE) ×6
SUT VIC AB 2-0 CT1 TAPERPNT 27 (SUTURE) ×3 IMPLANT
SUT VIC AB 2-0 CTB1 (SUTURE) ×3 IMPLANT
SUT VIC AB 3-0 CT1 27 (SUTURE) ×4
SUT VIC AB 3-0 CT1 TAPERPNT 27 (SUTURE) ×2 IMPLANT
SUT VIC AB 3-0 SH 27 (SUTURE) ×2
SUT VIC AB 3-0 SH 27X BRD (SUTURE) ×1 IMPLANT
SYR CONTROL 10ML LL (SYRINGE) ×6 IMPLANT
TOWEL GREEN STERILE (TOWEL DISPOSABLE) ×3 IMPLANT
TOWEL GREEN STERILE FF (TOWEL DISPOSABLE) ×3 IMPLANT
TUBE CONNECTING 12'X1/4 (SUCTIONS) ×1
TUBE CONNECTING 12X1/4 (SUCTIONS) ×2 IMPLANT
WATER STERILE IRR 1000ML POUR (IV SOLUTION) ×3 IMPLANT
YANKAUER SUCT BULB TIP NO VENT (SUCTIONS) IMPLANT

## 2020-03-16 NOTE — Brief Op Note (Signed)
   03/16/2020  4:02 PM  PATIENT:  [name]  79 y.o. female  PRE-OPERATIVE DIAGNOSIS:  left ankle fracture  POST-OPERATIVE DIAGNOSIS:  left ankle fracture  PROCEDURE:  Procedure(s): OPEN REDUCTION INTERNAL FIXATION (ORIF) LEFT ANKLE FRACTURE  SURGEON:  Surgeon(s): Cammy Copa, MD  ASSISTANT: magnant pa  ANESTHESIA:   general  EBL: 25 ml    Total I/O In: 1100 [I.V.:1000; IV Piggyback:100] Out: 50 [Blood:50]  BLOOD ADMINISTERED: none  DRAINS: none   LOCAL MEDICATIONS USED:  vanc marcaine mso4 clonidine  SPECIMEN:  No Specimen  COUNTS:  YES  TOURNIQUET:  * Missing tourniquet times found for documented tourniquets in log: [id number] *  DICTATION: .Other Dictation: Dictation Number [id number]  PLAN OF CARE: Discharge to home after PACU  PATIENT DISPOSITION:  PACU - hemodynamically stable

## 2020-03-16 NOTE — Anesthesia Procedure Notes (Addendum)
Anesthesia Regional Block: Popliteal block   Pre-Anesthetic Checklist: ,, timeout performed, Correct Patient, Correct Site, Correct Laterality, Correct Procedure, Correct Position, site marked, Risks and benefits discussed,  Surgical consent,  Pre-op evaluation,  At surgeon's request and post-op pain management  Laterality: Left  Prep: chloraprep       Needles:  Injection technique: Single-shot  Needle Type: Echogenic Stimulator Needle     Needle Length: 10cm  Needle Gauge: 21     Additional Needles:   Procedures:, nerve stimulator,,,,,,,   Nerve Stimulator or Paresthesia:  Response: 0.4 mA,   Additional Responses:   Narrative:  Start time: 03/16/2020 12:45 PM End time: 03/16/2020 1:00 PM Injection made incrementally with aspirations every 5 mL.  Performed by: Personally  Anesthesiologist: Arta Bruce, MD  Additional Notes: Monitors applied. Patient sedated. Sterile prep and drape,hand hygiene and sterile gloves were used. Relevant anatomy identified.Needle position confirmed.Local anesthetic injected incrementally after negative aspiration. Local anesthetic spread visualized around nerve(s). Vascular puncture avoided. No complications. Image printed for medical record.The patient tolerated the procedure well.  Additional Saphenous nerve block performed. 15cc Local Anesthetic mixture placed under ultrasonic guidance along the medio-inferior border of the Sartorious muscle 6 inches above the knee.  No Problems encountered.  Arta Bruce MD

## 2020-03-16 NOTE — Anesthesia Procedure Notes (Signed)
Procedure Name: Intubation Date/Time: 03/16/2020 1:31 PM Performed by: Lavell Luster, CRNA Pre-anesthesia Checklist: Patient identified, Emergency Drugs available, Suction available and Patient being monitored Patient Re-evaluated:Patient Re-evaluated prior to induction Oxygen Delivery Method: Circle System Utilized Preoxygenation: Pre-oxygenation with 100% oxygen Induction Type: IV induction Ventilation: Mask ventilation without difficulty Laryngoscope Size: Mac and 3 Grade View: Grade II Tube type: Oral Tube size: 7.0 mm Number of attempts: 1 Airway Equipment and Method: Stylet Placement Confirmation: ETT inserted through vocal cords under direct vision,  positive ETCO2 and breath sounds checked- equal and bilateral Secured at: 21 cm Tube secured with: Tape Dental Injury: Teeth and Oropharynx as per pre-operative assessment

## 2020-03-16 NOTE — Transfer of Care (Signed)
Immediate Anesthesia Transfer of Care Note  Patient: [name]  Procedure(s) Performed: OPEN REDUCTION INTERNAL FIXATION (ORIF) LEFT ANKLE FRACTURE (Left Ankle)  Patient Location: PACU  Anesthesia Type:General  Level of Consciousness: drowsy  Airway & Oxygen Therapy: Patient Spontanous Breathing and Patient connected to face mask oxygen  Post-op Assessment: Report given to RN and Post -op Vital signs reviewed and stable  Post vital signs: Reviewed and stable  Last Vitals:  Vitals Value Taken Time  BP 121/65 03/16/20 1603  Temp    Pulse 70 03/16/20 1606  Resp 16 03/16/20 1606  SpO2 97 % 03/16/20 1606  Vitals shown include unvalidated device data.  Last Pain:  Vitals:   03/16/20 1310  TempSrc:   PainSc: 0-No pain      Patients Stated Pain Goal: 5 (03/16/20 1038)  Complications: No complications documented.

## 2020-03-16 NOTE — Op Note (Signed)
[name], PAE MEDICAL RECORD NO:[id number] ACCOUNT [id number] DATE OF BIRTH:Aug 15, 1940 FACILITY[facility] LOCATION: [facility] PHYSICIAN:Mariha Sleeper Diamantina Providence, MD  OPERATIVE REPORT  DATE OF PROCEDURE:  03/16/2020  PREOPERATIVE DIAGNOSIS:  Left ankle bimalleolar ankle fracture.  POSTOPERATIVE DIAGNOSIS:  Left ankle bimalleolar ankle fracture.  PROCEDURE:  Left ankle bimalleolar ankle fracture.  PROCEDURE:  Open reduction internal fixation using Smith and Nephew implants, two 4.0 cannulated screws medially, one 7-hole plate laterally.  SURGEON:  Cammy Copa, MD  ASSISTANT:  Karenann Cai, PA.  INDICATIONS:  This is a 79 year old patient with left ankle pain following fracture.  Presents for operative management after 2-1/2 weeks to allow fracture blisters to be epithelialized.  Presents now for operative management after explanation of risks  and benefits.  PROCEDURE IN DETAIL:  The patient was brought to the operating room where general anesthetic was induced.  Preoperative antibiotics administered.  Timeout was called.  Left ankle prescrubbed with alcohol and Betadine, allowed to air dry, prepped with  DuraPrep solution and draped in sterile manner.  Ioban used to cover the operative field.  Ankle Esmarch utilized for approximately 50 minutes.  Incision was made posterior to the epithelialized fracture blister laterally.  Skin and subcutaneous tissue  were sharply divided.  Subperiosteal elevation was performed around the fracture site.  Fracture site was then mobilized.  Fibrinous tissue was removed from the fracture site.  Bone quality was poor.  Fracture was reduced and held with a bone reducing  clamp.  Lag screw placed proximal anterior to posterior inferior.  At this point, a 7-hole plate was applied with 5 screws proximally and all locking screws were filled distally.  Bone quality was poor and therefore maximum fixation required.  Next,  attention was directed towards the  medial side.  Moist sponge was placed into the lateral incision covered with an [place].  On the medial side, an incision made over the medial malleolar fracture.  Skin and subcutaneous tissue were sharply divided.   Fracture site was identified and mobilized.  Fibrinous tissue was removed.  Irrigation performed.  Fracture was reduced and held with a large tenaculum.  Because of the poor bone quality, it was decided to use 4.0 partially threaded cannulated screws to  achieve bicortical fixation on the far cortex laterally on the distal tibia.  This was performed and good fixation was achieved.  Mortise was stable and symmetric.  At this time, tourniquet was released.  Thorough irrigation performed on both incisions.   Vanc powder placed on both incisions and they were then closed using 3-0 Vicryl, 3-0 nylon on the medial incision, 2-0 Vicryl, 3-0 nylon on the lateral incision.  An incisional wound VAC was placed.  Posterior splint applied.  The patient tolerated the  procedure well without immediate complication.  Luke's assistance was required for opening and closing, mobilization of tissues.  His assistance was a medical necessity.  HN/NUANCE  D:03/16/2020 T:03/16/2020 JOB:[id number]

## 2020-03-16 NOTE — H&P (Signed)
[name] is an 79 y.o. female.   Chief Complaint: Left ankle pain HPI: [name] is a 79 year old patient with left ankle pain.  Had fracture approximately 2 weeks ago.  Fracture blisters were present which have now improved and epithelialized.  She has been off of her Eliquis in preparation for the surgery.  She takes Eliquis for atrial fibrillation.  Denies any personal or family history of DVT or pulmonary embolism.  She has good support at home.  Past Medical History:  Diagnosis Date  . CHF (congestive heart failure) (HCC)   . Coronary artery disease   . Dysrhythmia    a-fib  . HLD (hyperlipidemia)   . Hypertension     Past Surgical History:  Procedure Laterality Date  . ATRIAL FIBRILLATION ABLATION    . CHOLECYSTECTOMY    . COLONOSCOPY    . EYE SURGERY Bilateral    cataractions removed    History reviewed. No pertinent family history. Social History:  reports that she has never smoked. She has never used smokeless tobacco. She reports current alcohol use. She reports that she does not use drugs.  Allergies: No Known Allergies  Medications Prior to Admission  Medication Sig Dispense Refill  . acetaminophen (TYLENOL) 500 MG tablet Take 500-1,000 mg by mouth every 6 (six) hours as needed (for pain.).    Marland Kitchen atorvastatin (LIPITOR) 10 MG tablet Take 10 mg by mouth at bedtime.    . calcium carbonate (TUMS - DOSED IN MG ELEMENTAL CALCIUM) 500 MG chewable tablet Chew 1-2 tablets by mouth 3 (three) times daily as needed for indigestion or heartburn.    . sotalol (BETAPACE) 80 MG tablet Take 80 mg by mouth 2 (two) times daily.    Marland Kitchen HYDROcodone-acetaminophen (NORCO/VICODIN) 5-325 MG tablet Take 1 tablet by mouth every 6 (six) hours as needed for severe pain. (Patient not taking: Reported on 03/09/2020) 15 tablet 0  . Rivaroxaban (XARELTO) 15 MG TABS tablet Take 15 mg by mouth daily after supper.    Carlena Hurl 20 MG TABS tablet Take 20 mg by mouth daily.      Results for orders  placed or performed during the hospital encounter of 03/16/20 (from the past 48 hour(s))  CBC     Status: None   Collection Time: 03/16/20 10:07 AM  Result Value Ref Range   WBC 7.5 4.0 - 10.5 K/uL   RBC 4.50 3.87 - 5.11 MIL/uL   Hemoglobin 13.8 12.0 - 15.0 g/dL   HCT 54.6 27.0 - 35.0 %   MCV 95.1 80.0 - 100.0 fL   MCH 30.7 26.0 - 34.0 pg   MCHC 32.2 30.0 - 36.0 g/dL   RDW 09.3 81.8 - 29.9 %   Platelets 309 150 - 400 K/uL   nRBC 0.0 0.0 - 0.2 %    Comment: Performed at [facility], [address], [place], [place] [address]  Basic metabolic panel     Status: Abnormal   Collection Time: 03/16/20 10:07 AM  Result Value Ref Range   Sodium 142 135 - 145 mmol/L   Potassium 3.8 3.5 - 5.1 mmol/L   Chloride 104 98 - 111 mmol/L   CO2 24 22 - 32 mmol/L   Glucose, Bld 107 (H) 70 - 99 mg/dL    Comment: Glucose reference range applies only to samples taken after fasting for at least 8 hours.   BUN 14 8 - 23 mg/dL   Creatinine, Ser 6.78 0.44 - 1.00 mg/dL   Calcium 9.9  8.9 - 10.3 mg/dL   GFR, Estimated >07 >86 mL/min    Comment: (NOTE) Calculated using the CKD-EPI Creatinine Equation (2021)    Anion gap 14 5 - 15    Comment: Performed at [facility], [address] [place], [place] [address]   No results found.  Review of Systems  Musculoskeletal: Positive for arthralgias.  All other systems reviewed and are negative.   Blood pressure (!) 154/67, pulse 76, temperature 97.9 F (36.6 C), temperature source Oral, resp. rate 17, height 5\' 4"  (1.626 m), weight 88.5 kg, SpO2 97 %. Physical Exam Vitals reviewed.  HENT:     Head: Normocephalic.     Nose: Nose normal.     Mouth/Throat:     Mouth: Mucous membranes are moist.  Eyes:     Pupils: Pupils are equal, round, and reactive to light.  Cardiovascular:     Rate and Rhythm: Normal rate.     Pulses: Normal pulses.  Pulmonary:     Effort: Pulmonary effort is normal.  Abdominal:     General: Abdomen is flat.   Musculoskeletal:     Cervical back: Normal range of motion.  Skin:    General: Skin is warm.     Capillary Refill: Capillary refill takes less than 2 seconds.  Neurological:     General: No focal deficit present.     Mental Status: She is alert.  Psychiatric:        Mood and Affect: Mood normal.   Examination of the left ankle demonstrates some ecchymosis proximally.  Ankle dorsiflexion plantarflexion intact.  Compartments are soft.  Fracture blisters are epithelialized.  Incisions can be made on the lateral side which do not go into that area.  Medially the skin looks intact with healed abrasion.  Assessment/Plan Impression is unstable bimalleolar ankle fracture.  Plan is open reduction internal fixation of the ankle fracture.  Risk benefits are discussed include not limited to infection nerve vessel damage nonunion malunion as well as prolonged period of nonweightbearing.  Plan to start Eliquis back tonight.  Patient understands risk benefits.  All questions answered  , MD 03/16/2020, 11:13 AM

## 2020-03-16 NOTE — Anesthesia Postprocedure Evaluation (Signed)
Anesthesia Post Note  Patient: [name]  Procedure(s) Performed: OPEN REDUCTION INTERNAL FIXATION (ORIF) LEFT ANKLE FRACTURE (Left Ankle)     Patient location during evaluation: PACU Anesthesia Type: General Level of consciousness: awake and alert Pain management: pain level controlled Vital Signs Assessment: post-procedure vital signs reviewed and stable Respiratory status: spontaneous breathing, nonlabored ventilation, respiratory function stable and patient connected to nasal cannula oxygen Cardiovascular status: blood pressure returned to baseline and stable Postop Assessment: no apparent nausea or vomiting Anesthetic complications: no   No complications documented.  Last Vitals:  Vitals:   03/16/20 1705 03/16/20 1720  BP: 131/66 132/63  Pulse: 68 65  Resp: 13 11  Temp:    SpO2: 92% 95%    Last Pain:  Vitals:   03/16/20 1630  TempSrc:   PainSc: 5                  Bralynn Velador DAVID

## 2020-03-18 NOTE — Addendum Note (Signed)
Addendum  created 03/18/20 0948 by Arta Bruce, MD   Clinical Note Signed, Intraprocedure Blocks edited

## 2020-03-19 ENCOUNTER — Encounter (HOSPITAL_COMMUNITY): Payer: Self-pay | Admitting: Orthopedic Surgery

## 2020-03-22 ENCOUNTER — Other Ambulatory Visit: Payer: Self-pay

## 2020-03-22 MED ORDER — HYDROCODONE-ACETAMINOPHEN 5-325 MG PO TABS: 1.0000 | ORAL_TABLET | Freq: Four times a day (QID) | ORAL | 0 refills | Status: DC | PRN

## 2020-03-22 NOTE — Telephone Encounter (Signed)
Okay to take one at night before bed

## 2020-03-24 ENCOUNTER — Ambulatory Visit (INDEPENDENT_AMBULATORY_CARE_PROVIDER_SITE_OTHER): Payer: Medicare Other

## 2020-03-24 ENCOUNTER — Other Ambulatory Visit: Payer: Self-pay

## 2020-03-24 ENCOUNTER — Encounter: Payer: Self-pay | Admitting: Orthopedic Surgery

## 2020-03-24 ENCOUNTER — Ambulatory Visit (INDEPENDENT_AMBULATORY_CARE_PROVIDER_SITE_OTHER): Payer: Medicare Other | Admitting: Orthopedic Surgery

## 2020-03-24 VITALS — Ht 64.0 in | Wt 195.0 lb

## 2020-03-24 DIAGNOSIS — S82892G Other fracture of left lower leg, subsequent encounter for closed fracture with delayed healing: Secondary | ICD-10-CM | POA: Diagnosis not present

## 2020-03-29 ENCOUNTER — Ambulatory Visit (INDEPENDENT_AMBULATORY_CARE_PROVIDER_SITE_OTHER): Payer: Medicare Other | Admitting: Radiology

## 2020-03-29 ENCOUNTER — Encounter: Payer: Self-pay | Admitting: Radiology

## 2020-03-29 ENCOUNTER — Encounter: Payer: Self-pay | Admitting: Orthopedic Surgery

## 2020-03-29 DIAGNOSIS — S82892G Other fracture of left lower leg, subsequent encounter for closed fracture with delayed healing: Secondary | ICD-10-CM

## 2020-03-29 NOTE — Progress Notes (Signed)
Patient came in to office for possible suture removal status post ORIF left ankle fracture on 03/16/2020.  Incisions look good, sutures are intact, no drainage. Pictures were sent to Dr. August Saucer who advised sutures can be removed, steri strip, and apply ace bandage. Patient is to be strictly nonweightbearing.  Band-aid placed on small scab anterior ankle. Patient to follow up with Dr. August Saucer in the office in two weeks.

## 2020-03-29 NOTE — Progress Notes (Signed)
   Post-Op Visit Note   Patient: [name]           Date of Birth: 09/24/1940           MRN: [id number] Visit Date: 03/24/2020 PCP: System, Provider Not In   Assessment & Plan:  Chief Complaint:  Chief Complaint  Patient presents with  . Left Ankle - Routine Post Op    03/16/2020 Left ankle ORIF   Visit Diagnoses:  1. Closed fracture of left ankle with delayed healing, subsequent encounter     Plan: Patient is a 79 year old female presents s/p left ankle ORIF on 03/16/2020.  She is doing well overall and pain is well controlled.  She has discontinued pain medication and is only taking Tylenol for pain.  She is nonweightbearing on the operative ankle.  Wound VAC was removed today and sutures are intact.  No sign of infection or dehiscence of the incisions.  She is nonweightbearing and ambulating with the use of a knee scooter.  Plan to leave sutures in place for now with return office visit on Monday for suture removal.  Left ankle radiographs reviewed and shows hardware in good position with no displacement at the fracture site since the operative radiographs.  Fracture blisters healing well and do not communicate with the incision   . negative Homans no calf tenderness to direct palpation.  Follow-Up Instructions: No follow-ups on file.   Orders:  Orders Placed This Encounter  Procedures  . XR Ankle Complete Left   No orders of the defined types were placed in this encounter.   Imaging: No results found.  PMFS History: There are no problems to display for this patient.  Past Medical History:  Diagnosis Date  . CHF (congestive heart failure) (HCC)   . Coronary artery disease   . Dysrhythmia    a-fib  . HLD (hyperlipidemia)   . Hypertension     No family history on file.  Past Surgical History:  Procedure Laterality Date  . ATRIAL FIBRILLATION ABLATION    . CHOLECYSTECTOMY    . COLONOSCOPY    . EYE SURGERY Bilateral    cataractions removed  . ORIF ANKLE  FRACTURE Left 03/16/2020   Procedure: OPEN REDUCTION INTERNAL FIXATION (ORIF) LEFT ANKLE FRACTURE;  Surgeon: Cammy Copa, MD;  Location: [facility] OR;  Service: Orthopedics;  Laterality: Left;   Social History   Occupational History  . Not on file  Tobacco Use  . Smoking status: Never Smoker  . Smokeless tobacco: Never Used  Vaping Use  . Vaping Use: Never used  Substance and Sexual Activity  . Alcohol use: Yes    Comment: occasional wine  . Drug use: Never  . Sexual activity: Not on file

## 2020-04-12 ENCOUNTER — Ambulatory Visit (INDEPENDENT_AMBULATORY_CARE_PROVIDER_SITE_OTHER): Payer: Medicare Other | Admitting: Orthopedic Surgery

## 2020-04-12 ENCOUNTER — Ambulatory Visit (INDEPENDENT_AMBULATORY_CARE_PROVIDER_SITE_OTHER): Payer: Medicare Other

## 2020-04-12 DIAGNOSIS — S82892G Other fracture of left lower leg, subsequent encounter for closed fracture with delayed healing: Secondary | ICD-10-CM

## 2020-04-16 ENCOUNTER — Encounter: Payer: Self-pay | Admitting: Orthopedic Surgery

## 2020-04-17 ENCOUNTER — Encounter: Payer: Self-pay | Admitting: Orthopedic Surgery

## 2020-04-17 NOTE — Progress Notes (Signed)
   Post-Op Visit Note   Patient: [name]           Date of Birth: [birth year]           MRN: [id number] Visit Date: 04/12/2020 PCP: System, Provider Not In   Assessment & Plan:  Chief Complaint:  Chief Complaint  Patient presents with  . Left Ankle - Routine Post Op   Visit Diagnoses:  1. Fracture of malleolus, closed, left, with delayed healing, subsequent encounter     Plan: [name] is a patient who underwent left ankle fracture fixation 03/16/2020.  She has 2 small aspects of the lateral incision which have not healed yet.  No fluctuance erythema or drainage from these regions.  This is more of a part of the incision is healing from the inside out.  I will try her with some compression hose.  2-week return.  Fracture boot weightbearing as tolerated.  Come back in 2 weeks really only for check on the incision.  If it looks good and improving then we could let her get back up to [facility].  She staying with her son down here in the meantime until she is finished healing and is back to or close to normal.  Follow-Up Instructions: No follow-ups on file.   Orders:  Orders Placed This Encounter  Procedures  . XR Ankle Complete Left   No orders of the defined types were placed in this encounter.   Imaging: No results found.  PMFS History: There are no problems to display for this patient.  Past Medical History:  Diagnosis Date  . CHF (congestive heart failure) (HCC)   . Coronary artery disease   . Dysrhythmia    a-fib  . HLD (hyperlipidemia)   . Hypertension     No family history on file.  Past Surgical History:  Procedure Laterality Date  . ATRIAL FIBRILLATION ABLATION    . CHOLECYSTECTOMY    . COLONOSCOPY    . EYE SURGERY Bilateral    cataractions removed  . ORIF ANKLE FRACTURE Left 03/16/2020   Procedure: OPEN REDUCTION INTERNAL FIXATION (ORIF) LEFT ANKLE FRACTURE;  Surgeon: Cammy Copa, MD;  Location: [facility] OR;  Service: Orthopedics;   Laterality: Left;   Social History   Occupational History  . Not on file  Tobacco Use  . Smoking status: Never Smoker  . Smokeless tobacco: Never Used  Vaping Use  . Vaping Use: Never used  Substance and Sexual Activity  . Alcohol use: Yes    Comment: occasional wine  . Drug use: Never  . Sexual activity: Not on file

## 2020-04-19 NOTE — Telephone Encounter (Signed)
Normal to be sore and uncomfortable to some extent but she may benefit from using a walker or cane/single crutch to start with partial weightbearing and then progress to full-weightbearing over the next week and see if that helps. X-rays definitely looked good from last visit

## 2020-04-20 NOTE — Telephone Encounter (Signed)
Come in Friday or Thursday thx

## 2020-04-22 ENCOUNTER — Other Ambulatory Visit: Payer: Self-pay

## 2020-04-22 ENCOUNTER — Other Ambulatory Visit (HOSPITAL_COMMUNITY)
Admission: RE | Admit: 2020-04-22 | Discharge: 2020-04-22 | Disposition: A | Discharge status: Home / Self Care | Payer: Medicare Other | Source: Ambulatory Visit | Attending: Orthopedic Surgery | Admitting: Orthopedic Surgery

## 2020-04-22 ENCOUNTER — Encounter (HOSPITAL_COMMUNITY): Payer: Self-pay | Admitting: Orthopedic Surgery

## 2020-04-22 ENCOUNTER — Ambulatory Visit (INDEPENDENT_AMBULATORY_CARE_PROVIDER_SITE_OTHER): Payer: Medicare Other | Admitting: Orthopedic Surgery

## 2020-04-22 ENCOUNTER — Ambulatory Visit (INDEPENDENT_AMBULATORY_CARE_PROVIDER_SITE_OTHER): Payer: Medicare Other

## 2020-04-22 DIAGNOSIS — Z01812 Encounter for preprocedural laboratory examination: Secondary | ICD-10-CM | POA: Diagnosis present

## 2020-04-22 DIAGNOSIS — S82892G Other fracture of left lower leg, subsequent encounter for closed fracture with delayed healing: Secondary | ICD-10-CM

## 2020-04-22 DIAGNOSIS — Z20822 Contact with and (suspected) exposure to covid-19: Secondary | ICD-10-CM | POA: Diagnosis not present

## 2020-04-22 LAB — SARS CORONAVIRUS 2 (TAT 6-24 HRS): SARS Coronavirus 2: NEGATIVE

## 2020-04-22 MED ORDER — DOXYCYCLINE HYCLATE 100 MG PO TBEC: 100.0000 mg | DELAYED_RELEASE_TABLET | Freq: Two times a day (BID) | ORAL | 0 refills | Status: DC

## 2020-04-22 NOTE — Progress Notes (Signed)
PCP: In [place] Cardiologist:  Dr. Ladora Daniel, in [place]  EKG: 03/16/20 CXR:  08/12/10 ECHO:  Denies Stress Test:  Denies Cardiac Cath:  Denies  Fasting Blood Sugar- N/A Checks Blood Sugar_N/A_ times a day  OSA/CPAP: NO  ASA: No  Blood Thinners:  Last dose Xarelto 04/22/20  Covid test 04/22/20 pending  Anesthesia Review:  Yes, hx afib, CAD and CHF.   Patient denies shortness of breath, fever, cough, and chest pain at PAT appointment.  Patient verbalized understanding of instructions provided today at the PAT appointment.  Patient asked to review instructions at home and day of surgery.

## 2020-04-23 ENCOUNTER — Encounter: Payer: Self-pay | Admitting: Orthopedic Surgery

## 2020-04-23 NOTE — Progress Notes (Signed)
Post-Op Visit Note   Patient: [name]           Date of Birth: [birth year]           MRN: [id number] Visit Date: 04/22/2020 PCP: System, Provider Not In   Assessment & Plan:  Chief Complaint:  Chief Complaint  Patient presents with   Left Ankle - Follow-up, Pain   Visit Diagnoses:  1. Fracture of malleolus, closed, left, with delayed healing, subsequent encounter     Plan: [name] is a patient who is now about 5 weeks out left ankle open reduction internal fixation.  She is having some increasing pain and redness around the lateral incision.  On exam the area just at the lateral malleolus has no frank purulent discharge but is having trouble healing.  Does probe down to what feels like the plate.  The plate itself is not visible.  This area measures about 1-1/2 cm x 4 mm.  Medial incision looks intact.  No calf tenderness.  Patient is back on Xarelto.  Radiographs look good with good fracture healing and good position of the hardware.  Plan at this time is to obtain cultures after prepping that area with alcohol and Betadine.  Would like to start her on doxycycline as well over the weekend and then plan for hardware removal and wound VAC placement on Monday.  We will have to delay weightbearing for least 3 weeks to allow fracture healing to occur.  Radiographically looks like she does have some fracture healing and I do not see any radiographic evidence of osteomyelitis but in general I think we caught this relatively early.  The biggest issue here will be obtaining closure over the bone after surgery.  Plan on using specialized foot wound VAC for that purpose.  We did use the incisional VAC at the time of her index surgery.  We will have her hold off on her Xarelto until surgery.  Follow-Up Instructions: No follow-ups on file.   Orders:  Orders Placed This Encounter  Procedures   Anaerobic and Aerobic Culture   XR Ankle Complete Left   Meds ordered this encounter   Medications   doxycycline (DORYX) 100 MG EC tablet    Sig: Take 1 tablet (100 mg total) by mouth 2 (two) times daily.    Dispense:  20 tablet    Refill:  0    Imaging: No results found.  PMFS History: There are no problems to display for this patient.  Past Medical History:  Diagnosis Date   CHF (congestive heart failure) (HCC)    Coronary artery disease    Dysrhythmia    a-fib   HLD (hyperlipidemia)    Hypertension     History reviewed. No pertinent family history.  Past Surgical History:  Procedure Laterality Date   ATRIAL FIBRILLATION ABLATION     CHOLECYSTECTOMY     COLONOSCOPY     EYE SURGERY Bilateral    cataractions removed   ORIF ANKLE FRACTURE Left 03/16/2020   Procedure: OPEN REDUCTION INTERNAL FIXATION (ORIF) LEFT ANKLE FRACTURE;  Surgeon: Cammy Copa, MD;  Location: [facility] OR;  Service: Orthopedics;  Laterality: Left;   Social History   Occupational History   Not on file  Tobacco Use   Smoking status: Never Smoker   Smokeless tobacco: Never Used  Vaping Use   Vaping Use: Never used  Substance and Sexual Activity   Alcohol use: Yes    Comment: occasional wine   Drug use: Never  Sexual activity: Not on file

## 2020-04-23 NOTE — Progress Notes (Signed)
Anesthesia Chart Review: SAME DAY WORK-UP   Case: [id number] Date/Time: 04/26/20 1030   Procedure: LEFT ANKLE IRRIGATION AND DEBRIDEMENT, HARDWARE REMOVAL (Left )   Anesthesia type: General   Pre-op diagnosis: left anke infection   [facility]: [facility] ROOM 08 / [facility]   Surgeons: Cammy Copa, MD      DISCUSSION: Patient is a 80 year old female scheduled for the above procedure.  She fell on 02/26/2020 and sustained a left ankle fracture (unstable bimalleolar ankle fracture) while visiting relatives over Thanksgiving (from [place], [place]). She is s/p ORIF left ankle bimalleolar fracture on 03/16/20.   History includes never smoker, CAD, chronic diastolic CHF, atrial fibrillation (s/p ablation 10/15/15), HLD.   Cardiologist is Ladora Daniel, MD, [facility] in [place], [place].  Last visit 02/06/2020 (scanned under Media tab, Correspondence, 03/16/20).  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  He notes EKG then showed atrial fibrillation with poor R wave progression and nonspecific ST and T wave changes.  It does not appear that any new cardiac testing was ordered.  Last echo January 2021 outlined below.  71-month follow-up planned. He lists PCP as Dr. Ileana Roup.  By notes, last Xarelto 04/22/20. She is a same day work-up, so anesthesia team to evaluate on the day of surgery.  Known afib history, but 03/16/20 EKG showed NSR. 04/22/2020 presurgical COVID-19 test negative.   VS:  BP Readings from Last 3 Encounters:  03/16/20 132/63  02/26/20 (!) 165/82   Pulse Readings from Last 3 Encounters:  03/16/20 65  02/26/20 68     LABS: For day of procedure. As of 03/16/20, CBC and BMET normal with exception of glucose 107.     EKG: 03/16/20: Normal sinus rhythm Minimal voltage criteria for LVH, may be normal variant ( R in aVL ) Borderline ECG Since previous  tracing Normal sinus rhythm has replaced Atrial fibrillation with rapid ventricular response LVH by voltage NOW PRESENT Confirmed by Bryan Lemma ([id number]) on 03/17/2020 1:15:02 AM   CV: Echo 05/01/2019 (per Ladora Daniel, MD; copy scanned under Media tab, Correspondence 03/16/20): Conclusions: 1.  Overall left ventricular systolic function is low normal with EF between 50 to 55%. 2.  Diastolic filling pattern indicates impaired relaxation. 3.  Grade 1 diastolic dysfunction. 4.  CO: 6.05 L/min 5.  The left atrium is moderately dilated. 6.  LAESVI greater than 34 mL/m 7.  There is mild aortic valve sclerosis. 8.  Mild mitral regurgitation. 9.  Mild tricuspid regurgitation.  RVSP is normal at less than 35 mmHg. 10.  Sinus rhythm  "Cardionet: 2020, pt continues to have PAF episodes short"   Past Medical History:  Diagnosis Date   CHF (congestive heart failure) (HCC)    Coronary artery disease    Dysrhythmia    a-fib   HLD (hyperlipidemia)    Hypertension     Past Surgical History:  Procedure Laterality Date   ATRIAL FIBRILLATION ABLATION     CHOLECYSTECTOMY     COLONOSCOPY     EYE SURGERY Bilateral    cataractions removed   ORIF ANKLE FRACTURE Left 03/16/2020   Procedure: OPEN REDUCTION INTERNAL FIXATION (ORIF) LEFT ANKLE FRACTURE;  Surgeon: Cammy Copa, MD;  Location: [facility];  Service: Orthopedics;  Laterality: Left;    MEDICATIONS: No current facility-administered medications for this encounter.    atorvastatin (LIPITOR) 10 MG  tablet   calcium carbonate (TUMS - DOSED IN MG ELEMENTAL CALCIUM) 500 MG chewable tablet   doxycycline (DORYX) 100 MG EC tablet   HYDROcodone-acetaminophen (NORCO/VICODIN) 5-325 MG tablet   methocarbamol (ROBAXIN) 500 MG tablet   Rivaroxaban (XARELTO) 15 MG TABS tablet   sotalol (BETAPACE) 80 MG tablet    Shonna Chock, PA-C Surgical Short Stay/Anesthesiology [facility] Phone [phone] [facility] Phone [phone] 04/23/2020 9:52 AM

## 2020-04-23 NOTE — Anesthesia Preprocedure Evaluation (Addendum)
Anesthesia Evaluation  Patient identified by MRN, date of birth, ID band Patient awake    Reviewed: Allergy & Precautions, NPO status , Patient's Chart, lab work & pertinent test results  History of Anesthesia Complications Negative for: history of anesthetic complications  Airway Mallampati: II  TM Distance: >3 FB Neck ROM: Full    Dental  (+) Dental Advisory Given, Teeth Intact   Pulmonary neg pulmonary ROS,    Pulmonary exam normal        Cardiovascular hypertension, Pt. on medications and Pt. on home beta blockers Normal cardiovascular exam+ dysrhythmias Atrial Fibrillation    '21 TTE - (copy scanned under Media tab, Correspondence 03/16/20): EF between 50 to 55%.Grade 1 diastolic dysfunction.The left atrium is moderately dilated.There is mild aortic valve sclerosis.Mild mitral regurgitation.Mild tricuspid regurgitation.      Neuro/Psych negative neurological ROS  negative psych ROS   GI/Hepatic Neg liver ROS, GERD  Medicated and Controlled,  Endo/Other  negative endocrine ROS  Renal/GU negative Renal ROS     Musculoskeletal negative musculoskeletal ROS (+)   Abdominal   Peds  Hematology  On xarelto    Anesthesia Other Findings Covid test negative See PAT note    Reproductive/Obstetrics                           Anesthesia Physical Anesthesia Plan  ASA: III  Anesthesia Plan: General   Post-op Pain Management:    Induction: Intravenous  PONV Risk Score and Plan: 3 and Treatment may vary due to age or medical condition, Ondansetron and Dexamethasone  Airway Management Planned: LMA  Additional Equipment: None  Intra-op Plan:   Post-operative Plan: Extubation in OR  Informed Consent: I have reviewed the patients History and Physical, chart, labs and discussed the procedure including the risks, benefits and alternatives for the proposed anesthesia with the patient or  authorized representative who has indicated his/her understanding and acceptance.     Dental advisory given  Plan Discussed with: CRNA and Anesthesiologist  Anesthesia Plan Comments:       Anesthesia Quick Evaluation

## 2020-04-26 ENCOUNTER — Encounter (HOSPITAL_COMMUNITY): Payer: Self-pay | Admitting: Orthopedic Surgery

## 2020-04-26 ENCOUNTER — Observation Stay (HOSPITAL_COMMUNITY)
Admission: RE | Admit: 2020-04-26 | Discharge: 2020-04-27 | Disposition: A | Discharge status: Home / Self Care | Payer: Medicare Other | Attending: Orthopedic Surgery | Admitting: Orthopedic Surgery

## 2020-04-26 ENCOUNTER — Ambulatory Visit (HOSPITAL_COMMUNITY): Payer: Medicare Other

## 2020-04-26 ENCOUNTER — Observation Stay: Payer: Self-pay

## 2020-04-26 ENCOUNTER — Ambulatory Visit (HOSPITAL_COMMUNITY): Payer: Medicare Other | Admitting: Vascular Surgery

## 2020-04-26 ENCOUNTER — Ambulatory Visit: Payer: Medicare Other | Admitting: Orthopedic Surgery

## 2020-04-26 ENCOUNTER — Encounter (HOSPITAL_COMMUNITY)
Admission: RE | Disposition: A | Discharge status: Home / Self Care | Payer: Self-pay | Source: Home / Self Care | Attending: Orthopedic Surgery

## 2020-04-26 ENCOUNTER — Other Ambulatory Visit: Payer: Self-pay

## 2020-04-26 DIAGNOSIS — I10 Essential (primary) hypertension: Secondary | ICD-10-CM | POA: Insufficient documentation

## 2020-04-26 DIAGNOSIS — T8131XA Disruption of external operation (surgical) wound, not elsewhere classified, initial encounter: Secondary | ICD-10-CM | POA: Diagnosis not present

## 2020-04-26 DIAGNOSIS — T847XXA Infection and inflammatory reaction due to other internal orthopedic prosthetic devices, implants and grafts, initial encounter: Principal | ICD-10-CM | POA: Insufficient documentation

## 2020-04-26 DIAGNOSIS — I2510 Atherosclerotic heart disease of native coronary artery without angina pectoris: Secondary | ICD-10-CM | POA: Insufficient documentation

## 2020-04-26 DIAGNOSIS — Z79899 Other long term (current) drug therapy: Secondary | ICD-10-CM | POA: Diagnosis not present

## 2020-04-26 DIAGNOSIS — M6281 Muscle weakness (generalized): Secondary | ICD-10-CM | POA: Insufficient documentation

## 2020-04-26 DIAGNOSIS — I509 Heart failure, unspecified: Secondary | ICD-10-CM | POA: Diagnosis not present

## 2020-04-26 DIAGNOSIS — M868X7 Other osteomyelitis, ankle and foot: Secondary | ICD-10-CM

## 2020-04-26 DIAGNOSIS — M869 Osteomyelitis, unspecified: Secondary | ICD-10-CM

## 2020-04-26 DIAGNOSIS — Z20822 Contact with and (suspected) exposure to covid-19: Secondary | ICD-10-CM | POA: Insufficient documentation

## 2020-04-26 HISTORY — PX: HARDWARE REMOVAL: SHX979

## 2020-04-26 LAB — CBC
HCT: 42.2 % (ref 36.0–46.0)
Hemoglobin: 12.9 g/dL (ref 12.0–15.0)
MCH: 29.6 pg (ref 26.0–34.0)
MCHC: 30.6 g/dL (ref 30.0–36.0)
MCV: 96.8 fL (ref 80.0–100.0)
Platelets: 347 10*3/uL (ref 150–400)
RBC: 4.36 MIL/uL (ref 3.87–5.11)
RDW: 12.2 % (ref 11.5–15.5)
WBC: 8 10*3/uL (ref 4.0–10.5)
nRBC: 0 % (ref 0.0–0.2)

## 2020-04-26 LAB — BASIC METABOLIC PANEL WITH GFR
Anion gap: 9 (ref 5–15)
BUN: 16 mg/dL (ref 8–23)
CO2: 27 mmol/L (ref 22–32)
Calcium: 9.3 mg/dL (ref 8.9–10.3)
Chloride: 102 mmol/L (ref 98–111)
Creatinine, Ser: 0.93 mg/dL (ref 0.44–1.00)
GFR, Estimated: >60 mL/min
Glucose, Bld: 106 mg/dL — ABNORMAL HIGH (ref 70–99)
Potassium: 3.6 mmol/L (ref 3.5–5.1)
Sodium: 138 mmol/L (ref 135–145)

## 2020-04-26 LAB — CK: Total CK: 21 U/L — ABNORMAL LOW (ref 38–234)

## 2020-04-26 LAB — SURGICAL PCR SCREEN
MRSA, PCR: NEGATIVE
Staphylococcus aureus: NEGATIVE

## 2020-04-26 SURGERY — REMOVAL, HARDWARE
Anesthesia: General | Site: Ankle | Laterality: Left

## 2020-04-26 MED ORDER — SODIUM CHLORIDE 0.9 % IV SOLN
2.0000 g | Freq: Once | INTRAVENOUS | Status: AC
  Administered 2020-04-26: 2 g via INTRAVENOUS
  Filled 2020-04-26: qty 2

## 2020-04-26 MED ORDER — MORPHINE SULFATE (PF) 2 MG/ML IV SOLN
0.5000 mg | INTRAVENOUS | Status: DC | PRN
  Administered 2020-04-26: 0.5 mg via INTRAVENOUS
  Filled 2020-04-26: qty 1

## 2020-04-26 MED ORDER — CEFAZOLIN SODIUM-DEXTROSE 2-4 GM/100ML-% IV SOLN
2.0000 g | INTRAVENOUS | Status: AC
  Administered 2020-04-26: 2 g via INTRAVENOUS
  Filled 2020-04-26: qty 100

## 2020-04-26 MED ORDER — 0.9 % SODIUM CHLORIDE (POUR BTL) OPTIME
TOPICAL | Status: DC | PRN
  Administered 2020-04-26: 1000 mL

## 2020-04-26 MED ORDER — SOTALOL HCL 80 MG PO TABS
80.0000 mg | ORAL_TABLET | Freq: Two times a day (BID) | ORAL | Status: DC
Start: 2020-04-26 — End: 2020-04-27
  Administered 2020-04-26 – 2020-04-27 (×2): 80 mg via ORAL
  Filled 2020-04-26 (×3): qty 1

## 2020-04-26 MED ORDER — ONDANSETRON HCL 4 MG/2ML IJ SOLN: 4.0000 mg | Freq: Once | INTRAMUSCULAR | Status: DC | PRN

## 2020-04-26 MED ORDER — POVIDONE-IODINE 7.5 % EX SOLN: Freq: Once | CUTANEOUS | Status: DC

## 2020-04-26 MED ORDER — OXYCODONE HCL 5 MG PO TABS: 5.0000 mg | ORAL_TABLET | Freq: Once | ORAL | Status: DC | PRN

## 2020-04-26 MED ORDER — CHLORHEXIDINE GLUCONATE 0.12 % MT SOLN
OROMUCOSAL | Status: AC
  Administered 2020-04-26: 15 mL via OROMUCOSAL
  Filled 2020-04-26: qty 15

## 2020-04-26 MED ORDER — CHLORHEXIDINE GLUCONATE 0.12 % MT SOLN: 15.0000 mL | Freq: Once | OROMUCOSAL | Status: AC

## 2020-04-26 MED ORDER — PROPOFOL 10 MG/ML IV BOLUS
INTRAVENOUS | Status: DC | PRN
  Administered 2020-04-26: 140 mg via INTRAVENOUS

## 2020-04-26 MED ORDER — ACETAMINOPHEN 325 MG PO TABS: 325.0000 mg | ORAL_TABLET | Freq: Four times a day (QID) | ORAL | Status: DC | PRN

## 2020-04-26 MED ORDER — PROPOFOL 10 MG/ML IV BOLUS
INTRAVENOUS | Status: AC
  Filled 2020-04-26: qty 20

## 2020-04-26 MED ORDER — DEXAMETHASONE SODIUM PHOSPHATE 10 MG/ML IJ SOLN
INTRAMUSCULAR | Status: DC | PRN
  Administered 2020-04-26: 4 mg via INTRAVENOUS

## 2020-04-26 MED ORDER — ONDANSETRON HCL 4 MG/2ML IJ SOLN
INTRAMUSCULAR | Status: AC
  Filled 2020-04-26: qty 2

## 2020-04-26 MED ORDER — SODIUM CHLORIDE 0.9 % IV SOLN
2.0000 g | INTRAVENOUS | Status: DC
  Administered 2020-04-27: 2 g via INTRAVENOUS
  Filled 2020-04-26: qty 2

## 2020-04-26 MED ORDER — PHENYLEPHRINE HCL (PRESSORS) 10 MG/ML IV SOLN
INTRAVENOUS | Status: DC | PRN
  Administered 2020-04-26: 80 ug via INTRAVENOUS
  Administered 2020-04-26 (×2): 40 ug via INTRAVENOUS

## 2020-04-26 MED ORDER — HYDROCODONE-ACETAMINOPHEN 5-325 MG PO TABS
1.0000 | ORAL_TABLET | ORAL | Status: DC | PRN
  Administered 2020-04-26: 2 via ORAL
  Administered 2020-04-26 – 2020-04-27 (×2): 1 via ORAL
  Administered 2020-04-27: 2 via ORAL
  Filled 2020-04-26 (×2): qty 2
  Filled 2020-04-26 (×2): qty 1

## 2020-04-26 MED ORDER — ONDANSETRON HCL 4 MG/2ML IJ SOLN
INTRAMUSCULAR | Status: DC | PRN
  Administered 2020-04-26: 4 mg via INTRAVENOUS

## 2020-04-26 MED ORDER — BUPIVACAINE HCL (PF) 0.25 % IJ SOLN
INTRAMUSCULAR | Status: DC | PRN
  Administered 2020-04-26: 30 mL

## 2020-04-26 MED ORDER — ONDANSETRON HCL 4 MG PO TABS: 4.0000 mg | ORAL_TABLET | Freq: Four times a day (QID) | ORAL | Status: DC | PRN

## 2020-04-26 MED ORDER — METHOCARBAMOL 1000 MG/10ML IJ SOLN
500.0000 mg | Freq: Four times a day (QID) | INTRAVENOUS | Status: DC | PRN
  Filled 2020-04-26: qty 5

## 2020-04-26 MED ORDER — LACTATED RINGERS IV SOLN: INTRAVENOUS | Status: DC

## 2020-04-26 MED ORDER — FENTANYL CITRATE (PF) 250 MCG/5ML IJ SOLN
INTRAMUSCULAR | Status: AC
  Filled 2020-04-26: qty 5

## 2020-04-26 MED ORDER — ORAL CARE MOUTH RINSE: 15.0000 mL | Freq: Once | OROMUCOSAL | Status: AC

## 2020-04-26 MED ORDER — METOCLOPRAMIDE HCL 5 MG PO TABS: 5.0000 mg | ORAL_TABLET | Freq: Three times a day (TID) | ORAL | Status: DC | PRN

## 2020-04-26 MED ORDER — BUPIVACAINE HCL (PF) 0.25 % IJ SOLN
INTRAMUSCULAR | Status: AC
  Filled 2020-04-26: qty 30

## 2020-04-26 MED ORDER — FENTANYL CITRATE (PF) 250 MCG/5ML IJ SOLN
INTRAMUSCULAR | Status: DC | PRN
  Administered 2020-04-26 (×2): 50 ug via INTRAVENOUS
  Administered 2020-04-26 (×2): 25 ug via INTRAVENOUS

## 2020-04-26 MED ORDER — POVIDONE-IODINE 10 % EX SWAB
2.0000 "application " | Freq: Once | CUTANEOUS | Status: AC
  Administered 2020-04-26: 2 via TOPICAL

## 2020-04-26 MED ORDER — LACTATED RINGERS IV SOLN: INTRAVENOUS | Status: AC

## 2020-04-26 MED ORDER — CEFAZOLIN SODIUM-DEXTROSE 1-4 GM/50ML-% IV SOLN: 1.0000 g | Freq: Three times a day (TID) | INTRAVENOUS | Status: DC

## 2020-04-26 MED ORDER — OXYCODONE HCL 5 MG/5ML PO SOLN
5.0000 mg | Freq: Once | ORAL | Status: DC | PRN
Start: 2020-04-26 — End: 2020-04-26

## 2020-04-26 MED ORDER — CHLORHEXIDINE GLUCONATE CLOTH 2 % EX PADS
6.0000 | MEDICATED_PAD | Freq: Every day | CUTANEOUS | Status: DC
  Administered 2020-04-26 – 2020-04-27 (×2): 6 via TOPICAL

## 2020-04-26 MED ORDER — METOCLOPRAMIDE HCL 5 MG/ML IJ SOLN: 5.0000 mg | Freq: Three times a day (TID) | INTRAMUSCULAR | Status: DC | PRN

## 2020-04-26 MED ORDER — SODIUM CHLORIDE 0.9% FLUSH: 10.0000 mL | INTRAVENOUS | Status: DC | PRN

## 2020-04-26 MED ORDER — ONDANSETRON HCL 4 MG/2ML IJ SOLN: 4.0000 mg | Freq: Four times a day (QID) | INTRAMUSCULAR | Status: DC | PRN

## 2020-04-26 MED ORDER — PHENYLEPHRINE 40 MCG/ML (10ML) SYRINGE FOR IV PUSH (FOR BLOOD PRESSURE SUPPORT)
PREFILLED_SYRINGE | INTRAVENOUS | Status: AC
  Filled 2020-04-26: qty 10

## 2020-04-26 MED ORDER — METHOCARBAMOL 500 MG PO TABS
500.0000 mg | ORAL_TABLET | Freq: Four times a day (QID) | ORAL | Status: DC | PRN
  Administered 2020-04-26 – 2020-04-27 (×2): 500 mg via ORAL
  Filled 2020-04-26 (×2): qty 1

## 2020-04-26 MED ORDER — SODIUM CHLORIDE 0.9 % IV SOLN
700.0000 mg | Freq: Every day | INTRAVENOUS | Status: DC
  Administered 2020-04-26 – 2020-04-27 (×2): 700 mg via INTRAVENOUS
  Filled 2020-04-26 (×3): qty 14

## 2020-04-26 MED ORDER — LIDOCAINE 2% (20 MG/ML) 5 ML SYRINGE
INTRAMUSCULAR | Status: DC | PRN
  Administered 2020-04-26: 60 mg via INTRAVENOUS

## 2020-04-26 MED ORDER — DOCUSATE SODIUM 100 MG PO CAPS
100.0000 mg | ORAL_CAPSULE | Freq: Two times a day (BID) | ORAL | Status: DC
  Administered 2020-04-26 – 2020-04-27 (×2): 100 mg via ORAL
  Filled 2020-04-26 (×2): qty 1

## 2020-04-26 MED ORDER — FENTANYL CITRATE (PF) 100 MCG/2ML IJ SOLN: 25.0000 ug | INTRAMUSCULAR | Status: DC | PRN

## 2020-04-26 MED ORDER — ATORVASTATIN CALCIUM 10 MG PO TABS
10.0000 mg | ORAL_TABLET | Freq: Every day | ORAL | Status: DC
  Administered 2020-04-26: 10 mg via ORAL
  Filled 2020-04-26: qty 1

## 2020-04-26 MED ORDER — RIVAROXABAN 15 MG PO TABS
15.0000 mg | ORAL_TABLET | Freq: Every day | ORAL | Status: DC
  Filled 2020-04-26: qty 1

## 2020-04-26 SURGICAL SUPPLY — 54 items
BANDAGE ESMARK 6X9 LF (GAUZE/BANDAGES/DRESSINGS) ×1 IMPLANT
BLADE SURG 15 STRL LF DISP TIS (BLADE) ×1 IMPLANT
BLADE SURG 15 STRL SS (BLADE) ×1
BNDG COHESIVE 4X5 TAN STRL (GAUZE/BANDAGES/DRESSINGS) IMPLANT
BNDG ELASTIC 4X5.8 VLCR STR LF (GAUZE/BANDAGES/DRESSINGS) ×2 IMPLANT
BNDG ELASTIC 6X5.8 VLCR STR LF (GAUZE/BANDAGES/DRESSINGS) ×2 IMPLANT
BNDG ESMARK 4X9 LF (GAUZE/BANDAGES/DRESSINGS) ×2 IMPLANT
BNDG ESMARK 6X9 LF (GAUZE/BANDAGES/DRESSINGS) ×2
BNDG GAUZE ELAST 4 BULKY (GAUZE/BANDAGES/DRESSINGS) ×2 IMPLANT
COVER SURGICAL LIGHT HANDLE (MISCELLANEOUS) ×2 IMPLANT
COVER WAND RF STERILE (DRAPES) ×2 IMPLANT
DRAPE DERMATAC (DRAPES) ×2 IMPLANT
DRAPE EXTREMITY T 121X128X90 (DISPOSABLE) ×2 IMPLANT
DRAPE HALF SHEET 40X57 (DRAPES) ×2 IMPLANT
DRAPE IMP U-DRAPE 54X76 (DRAPES) ×2 IMPLANT
DRAPE INCISE IOBAN 66X45 STRL (DRAPES) IMPLANT
DRSG EMULSION OIL 3X3 NADH (GAUZE/BANDAGES/DRESSINGS) IMPLANT
DRSG PAD ABDOMINAL 8X10 ST (GAUZE/BANDAGES/DRESSINGS) ×10 IMPLANT
ELECT REM PT RETURN 9FT ADLT (ELECTROSURGICAL) ×2
ELECTRODE REM PT RTRN 9FT ADLT (ELECTROSURGICAL) ×1 IMPLANT
GAUZE SPONGE 4X4 12PLY STRL (GAUZE/BANDAGES/DRESSINGS) ×2 IMPLANT
GAUZE XEROFORM 1X8 LF (GAUZE/BANDAGES/DRESSINGS) IMPLANT
GLOVE ECLIPSE 8.0 STRL XLNG CF (GLOVE) ×2 IMPLANT
GLOVE SRG 8 PF TXTR STRL LF DI (GLOVE) ×1 IMPLANT
GLOVE SURG UNDER POLY LF SZ8 (GLOVE) ×1
GOWN STRL REUS W/ TWL LRG LVL3 (GOWN DISPOSABLE) ×3 IMPLANT
GOWN STRL REUS W/TWL LRG LVL3 (GOWN DISPOSABLE) ×3
KIT BASIN OR (CUSTOM PROCEDURE TRAY) ×2 IMPLANT
KIT TURNOVER KIT B (KITS) ×2 IMPLANT
MANIFOLD NEPTUNE II (INSTRUMENTS) ×2 IMPLANT
NEEDLE 22X1 1/2 (OR ONLY) (NEEDLE) ×2 IMPLANT
NS IRRIG 1000ML POUR BTL (IV SOLUTION) ×2 IMPLANT
PACK GENERAL/GYN (CUSTOM PROCEDURE TRAY) ×2 IMPLANT
PAD ARMBOARD 7.5X6 YLW CONV (MISCELLANEOUS) ×4 IMPLANT
PAD CAST 4YDX4 CTTN HI CHSV (CAST SUPPLIES) IMPLANT
PADDING CAST ABS 4INX4YD NS (CAST SUPPLIES) ×3
PADDING CAST ABS COTTON 4X4 ST (CAST SUPPLIES) ×3 IMPLANT
PADDING CAST COTTON 4X4 STRL (CAST SUPPLIES)
PADDING CAST COTTON 6X4 STRL (CAST SUPPLIES) IMPLANT
PREVENA RESTOR AXIOFORM 29X28 (GAUZE/BANDAGES/DRESSINGS) ×2 IMPLANT
STAPLER VISISTAT 35W (STAPLE) ×2 IMPLANT
STOCKINETTE IMPERVIOUS 9X36 MD (GAUZE/BANDAGES/DRESSINGS) ×2 IMPLANT
SUT ETHILON 2 0 FS 18 (SUTURE) ×12 IMPLANT
SUT ETHILON 4 0 FS 1 (SUTURE) IMPLANT
SUT VIC AB 0 CT1 27 (SUTURE)
SUT VIC AB 0 CT1 27XBRD ANBCTR (SUTURE) IMPLANT
SUT VIC AB 2-0 CT1 27 (SUTURE)
SUT VIC AB 2-0 CT1 TAPERPNT 27 (SUTURE) IMPLANT
SWAB CULTURE ESWAB REG 1ML (MISCELLANEOUS) ×2 IMPLANT
SWAB CULTURE LIQ STUART DBL (MISCELLANEOUS) ×2 IMPLANT
SYR CONTROL 10ML LL (SYRINGE) ×2 IMPLANT
TOWEL GREEN STERILE (TOWEL DISPOSABLE) ×2 IMPLANT
TOWEL GREEN STERILE FF (TOWEL DISPOSABLE) ×2 IMPLANT
WATER STERILE IRR 1000ML POUR (IV SOLUTION) ×2 IMPLANT

## 2020-04-26 NOTE — Consult Note (Signed)
Date of Admission:  04/26/2020          Reason for Consult: Left ankle infection   Referring Provider: Rise Paganini, MD   Assessment:  1. Left ankle bone infection s/p ORIF on 03/16/20. Underwent irrigation and excisional debridement with removal of hardware today (1/24) due to concern for infection. X-ray ankle shows healed fracture. Unable to rule out osteomyelitis due to poor bone quality so plan to treat empirically.  Plan:  1. Will treat as a possible osteomyelitis with empirical antibiotics. Start IV Rocephin 2 g x1 dose and Daptomycin 700 mg x1 dose today, then IV Rocephin and Daptomycin in the outpatient for 6 weeks.  2. F/u CK, CRP, Sed rate, Hep C and HIV 3. F/u aerobic/anaerobic deep wound tissue culture 4. Follow up with Dr. Daiva Eves in 4 weeks.   Active Problems:   Bone infection of left ankle (HCC)   Scheduled Meds: . atorvastatin  10 mg Oral QHS  . docusate sodium  100 mg Oral BID  . [START ON 04/27/2020] Rivaroxaban  15 mg Oral Q supper  . sotalol  80 mg Oral BID   Continuous Infusions: . cefTRIAXone (ROCEPHIN)  IV    . lactated ringers 75 mL/hr at 04/26/20 1353  . methocarbamol (ROBAXIN) IV     PRN Meds:.[START ON 04/27/2020] acetaminophen, HYDROcodone-acetaminophen, methocarbamol **OR** methocarbamol (ROBAXIN) IV, metoCLOPramide **OR** metoCLOPramide (REGLAN) injection, morphine injection, ondansetron **OR** ondansetron (ZOFRAN) IV  HPI: [name] is a 80 y.o. female w/ PMH of CHF, CAD, HLD, HTN and a recent left ankle fracture s/p left ankle ORIF who presented with a complaint of increasing pain and redness around the lateral incision. Patient states she came from [place] to visit her son here for Thanksgiving when she fell up the stairs and bent her left ankle. Patient was assessed in the ED and found to have a bimalleolar ankle fracture on imaging.  A cast was placed on her left foot and she was scheduled to see orthopedic surgery. The next few days,  she developed a blister on her left foot near the area of the ankle injury.  Patient saw orthopedic surgery on 03/16/2020 and open reduction internal fixation of ankle fracture by Dr. August Saucer.  Patient did well postoperatively being nonweightbearing for about 4 weeks. Patient followed up with Dr. August Saucer on 04/12/20 and was instructed to wear a stocking and boot. Patient unable to tolerate stocking or boot due to irritation and difficulty being pressure on her left ankle.   She started noticing drainage form the incision site and some tenderness around the incision site. She was re-evaluated by Dr. August Saucer on 04/22/20 and the foot had not healed appropriate but but there were no frank purulent discharge. Xray of left ankle showed healing fracture and no evidence of osteo. She was started on doxycycline 100 mg BID and she had plate removed today with placement of wound vac. ID was consulted for possible osteomyelitis of the left foot.   Today, patient denies any left pain but states it is sore. She denies any fever, chills, N/V, CP, leg swelling or new trauma. Patient's left leg and foot placed in cast but neurovascularly intact.    Review of Systems: Review of Systems  Constitutional: Negative for chills and fever.  Eyes: Negative for blurred vision.  Respiratory: Negative for shortness of breath and wheezing.   Cardiovascular: Negative for chest pain and leg swelling.  Gastrointestinal: Negative for abdominal pain and nausea.  Genitourinary: Negative  for dysuria.  Musculoskeletal: Positive for joint pain. Negative for myalgias.       Left ankle pain  Neurological: Negative for focal weakness.    Past Medical History:  Diagnosis Date  . CHF (congestive heart failure) (HCC)   . Coronary artery disease   . Dysrhythmia    a-fib  . HLD (hyperlipidemia)   . Hypertension     Social History   Tobacco Use  . Smoking status: Never Smoker  . Smokeless tobacco: Never Used  Vaping Use  . Vaping Use:  Never used  Substance Use Topics  . Alcohol use: Yes    Comment: occasional wine  . Drug use: Never    History reviewed. No pertinent family history. No Known Allergies  OBJECTIVE: Blood pressure 131/72, pulse 67, temperature 98 F (36.7 C), resp. rate 13, height 5\' 4"  (1.626 m), weight 86.2 kg, SpO2 96 %.  Physical Exam Constitutional:      Appearance: Normal appearance.  HENT:     Head: Normocephalic and atraumatic.  Cardiovascular:     Rate and Rhythm: Normal rate and regular rhythm.     Pulses: Normal pulses.     Heart sounds: Normal heart sounds. No murmur heard.   Pulmonary:     Effort: Pulmonary effort is normal.     Breath sounds: Normal breath sounds. No wheezing.  Abdominal:     General: Bowel sounds are normal.     Palpations: Abdomen is soft.     Tenderness: There is no abdominal tenderness.  Musculoskeletal:        General: Normal range of motion.     Cervical back: Normal range of motion and neck supple. No tenderness.     Comments: LLE from foot to knee in a cast. Limited ROM of the leg.   Skin:    General: Skin is warm and dry.     Capillary Refill: Capillary refill takes less than 2 seconds.     Findings: No rash.  Neurological:     General: No focal deficit present.     Mental Status: She is alert and oriented to person, place, and time.     Sensory: No sensory deficit.  Psychiatric:        Mood and Affect: Mood normal.     Lab Results Lab Results  Component Value Date   WBC 8.0 04/26/2020   HGB 12.9 04/26/2020   HCT 42.2 04/26/2020   MCV 96.8 04/26/2020   PLT 347 04/26/2020    Lab Results  Component Value Date   CREATININE 0.93 04/26/2020   BUN 16 04/26/2020   NA 138 04/26/2020   K 3.6 04/26/2020   CL 102 04/26/2020   CO2 27 04/26/2020    Lab Results  Component Value Date   ALT 97 (H) 08/12/2010   AST 51 (H) 08/12/2010   ALKPHOS 108 08/12/2010   BILITOT 0.6 08/12/2010     Microbiology: Recent Results (from the past 240  hour(s))  SARS CORONAVIRUS 2 (TAT 6-24 HRS) Nasopharyngeal Nasopharyngeal Swab     Status: None   Collection Time: 04/22/20 11:05 AM   Specimen: Nasopharyngeal Swab  Result Value Ref Range Status   SARS Coronavirus 2 NEGATIVE NEGATIVE Final    Comment: (NOTE) SARS-CoV-2 target nucleic acids are NOT DETECTED.  The SARS-CoV-2 RNA is generally detectable in upper and lower respiratory specimens during the acute phase of infection. Negative results do not preclude SARS-CoV-2 infection, do not rule out co-infections with other pathogens, and should not  be used as the sole basis for treatment or other patient management decisions. Negative results must be combined with clinical observations, patient history, and epidemiological information. The expected result is Negative.  Fact Sheet for Patients: HairSlick.no  Fact Sheet for Healthcare Providers: quierodirigir.com  This test is not yet approved or cleared by the [place] FDA and  has been authorized for detection and/or diagnosis of SARS-CoV-2 by FDA under an Emergency Use Authorization (EUA). This EUA will remain  in effect (meaning this test can be used) for the duration of the COVID-19 declaration under Se ction 564(b)(1) of the Act, 21 U.S.C. section 360bbb-3(b)(1), unless the authorization is terminated or revoked sooner.  Performed at [facility], [address], [place], [place] [address]   Surgical pcr screen     Status: None   Collection Time: 04/26/20  8:28 AM   Specimen: Nasal Mucosa; Nasal Swab  Result Value Ref Range Status   MRSA, PCR NEGATIVE NEGATIVE Final   Staphylococcus aureus NEGATIVE NEGATIVE Final    Comment: (NOTE) The Xpert SA Assay (FDA approved for NASAL specimens in patients 89 years of age and older), is one component of a comprehensive surveillance program. It is not intended to diagnose infection nor to guide or monitor  treatment. Performed at [facility], [address] [place], [place] [address]   Aerobic/Anaerobic Culture (surgical/deep wound)     Status: None (Preliminary result)   Collection Time: 04/26/20 11:24 AM   Specimen: PATH Soft tissue  Result Value Ref Range Status   Specimen Description TISSUE  Final   Special Requests LEFT ANKLE NEAR HARDWARE SPEC A  Final   Gram Stain   Final    RARE WBC PRESENT, PREDOMINANTLY MONONUCLEAR NO ORGANISMS SEEN Performed at [facility], [address], [place], [place] [address]    Culture PENDING  Incomplete   Report Status PENDING  Incomplete    Steffanie Rainwater, MD [facility] [phone] pager  04/26/2020, 2:44 PM

## 2020-04-26 NOTE — H&P (Signed)
[name] is an 80 y.o. female.   Chief Complaint: left ankle pain HPI: [name] is a 80 year old patient who is about 5 weeks out left ankle open reduction internal fixation.  Is having a little bit of very scant persistent drainage from the lateral incision.  Lateral incision was probed in clinic last week and found to go down to the plate.  No active fluctuance is present.  Patient presents now for hardware removal and wound VAC placement and infectious disease consult.  Past Medical History:  Diagnosis Date  . CHF (congestive heart failure) (HCC)   . Coronary artery disease   . Dysrhythmia    a-fib  . HLD (hyperlipidemia)   . Hypertension     Past Surgical History:  Procedure Laterality Date  . ATRIAL FIBRILLATION ABLATION    . CHOLECYSTECTOMY    . COLONOSCOPY    . EYE SURGERY Bilateral    cataractions removed  . ORIF ANKLE FRACTURE Left 03/16/2020   Procedure: OPEN REDUCTION INTERNAL FIXATION (ORIF) LEFT ANKLE FRACTURE;  Surgeon: Cammy Copa, MD;  Location: [facility] OR;  Service: Orthopedics;  Laterality: Left;    History reviewed. No pertinent family history. Social History:  reports that she has never smoked. She has never used smokeless tobacco. She reports current alcohol use. She reports that she does not use drugs.  Allergies: No Known Allergies  Medications Prior to Admission  Medication Sig Dispense Refill  . atorvastatin (LIPITOR) 10 MG tablet Take 10 mg by mouth at bedtime.    Marland Kitchen doxycycline (DORYX) 100 MG EC tablet Take 1 tablet (100 mg total) by mouth 2 (two) times daily. 20 tablet 0  . HYDROcodone-acetaminophen (NORCO/VICODIN) 5-325 MG tablet Take 1 tablet by mouth every 6 (six) hours as needed for moderate pain. 30 tablet 0  . methocarbamol (ROBAXIN) 500 MG tablet Take 1 tablet (500 mg total) by mouth every 8 (eight) hours as needed. 30 tablet 0  . Rivaroxaban (XARELTO) 15 MG TABS tablet Take 15 mg by mouth daily with supper.    . sotalol (BETAPACE) 80  MG tablet Take 80 mg by mouth 2 (two) times daily.    . calcium carbonate (TUMS - DOSED IN MG ELEMENTAL CALCIUM) 500 MG chewable tablet Chew 1-2 tablets by mouth 3 (three) times daily as needed for indigestion or heartburn.      Results for orders placed or performed during the hospital encounter of 04/26/20 (from the past 48 hour(s))  CBC     Status: None   Collection Time: 04/26/20  8:28 AM  Result Value Ref Range   WBC 8.0 4.0 - 10.5 K/uL   RBC 4.36 3.87 - 5.11 MIL/uL   Hemoglobin 12.9 12.0 - 15.0 g/dL   HCT 23.3 00.7 - 62.2 %   MCV 96.8 80.0 - 100.0 fL   MCH 29.6 26.0 - 34.0 pg   MCHC 30.6 30.0 - 36.0 g/dL   RDW 63.3 35.4 - 56.2 %   Platelets 347 150 - 400 K/uL   nRBC 0.0 0.0 - 0.2 %    Comment: Performed at [facility], [address] [place], [place] [address]  Basic metabolic panel     Status: Abnormal   Collection Time: 04/26/20  8:28 AM  Result Value Ref Range   Sodium 138 135 - 145 mmol/L   Potassium 3.6 3.5 - 5.1 mmol/L   Chloride 102 98 - 111 mmol/L   CO2 27 22 - 32 mmol/L   Glucose, Bld 106 (H)  70 - 99 mg/dL    Comment: Glucose reference range applies only to samples taken after fasting for at least 8 hours.   BUN 16 8 - 23 mg/dL   Creatinine, Ser 7.01 0.44 - 1.00 mg/dL   Calcium 9.3 8.9 - 77.9 mg/dL   GFR, Estimated >39 >03 mL/min    Comment: (NOTE) Calculated using the CKD-EPI Creatinine Equation (2021)    Anion gap 9 5 - 15    Comment: Performed at [facility], [address] [place], [place] [address]   DG MINI C-ARM IMAGE ONLY  Result Date: 04/26/2020 There is no interpretation for this exam.  This order is for images obtained during a surgical procedure.  Please See "Surgeries" Tab for more information regarding the procedure.    Review of Systems  Musculoskeletal: Positive for arthralgias.  All other systems reviewed and are negative.   Blood pressure (!) 164/67, pulse 69, temperature 97.9 F (36.6 C), resp. rate 17, height 5\' 4"   (1.626 m), weight 86.2 kg, SpO2 97 %. Physical Exam Vitals reviewed.  HENT:     Head: Normocephalic.     Nose: Nose normal.     Mouth/Throat:     Mouth: Mucous membranes are moist.  Eyes:     Pupils: Pupils are equal, round, and reactive to light.  Cardiovascular:     Rate and Rhythm: Normal rate.     Pulses: Normal pulses.  Pulmonary:     Effort: Pulmonary effort is normal.  Abdominal:     General: Abdomen is flat.  Musculoskeletal:     Cervical back: Normal range of motion.  Skin:    General: Skin is warm.  Neurological:     General: No focal deficit present.     Mental Status: She is alert.  Psychiatric:        Mood and Affect: Mood normal.   Examination of the left ankle demonstrates good range of motion.  Slight amount of erythema around the incision laterally.  Medial incision appears intact.  Foot is perfused and sensate.  No fluctuance from that left ankle region.  Assessment/Plan Impression is left ankle pain with very slow healing incision and potentially early exposed hardware within the distal aspect of the incision.  Plan is hardware removal.  Fracture does appear to be healed on radiographs.  Would not anticipate removing medial sided fixation to prevent ankle instability down the road in case the lateral side becomes unstable.  I would plan on a period of nonweightbearing.  Plan to use specialized wound VAC for 1 week to assist in healing.  We did use the incisional VAC after the first week as well.  All questions answered.  Anticipate nonweightbearing for least 4 weeks until we get the incision healed.  , MD 04/26/2020, 11:03 AM

## 2020-04-26 NOTE — Progress Notes (Signed)
PHARMACY CONSULT NOTE FOR:  OUTPATIENT  PARENTERAL ANTIBIOTIC THERAPY (OPAT)  Indication: Osteomyelitis  Regimen: Daptomycin 700 mg IV q24h AND ceftriaxone 2g IV q24h End date: 06/07/2020  IV antibiotic discharge orders are pended. To discharging provider:  please sign these orders via discharge navigator,  Select New Orders & click on the button choice - Manage This Unsigned Work.     Thank you for allowing pharmacy to be a part of this patient's care.  Jeannetta Nap 04/26/2020, 3:26 PM

## 2020-04-26 NOTE — Evaluation (Signed)
Physical Therapy Evaluation Patient Details Name: [name] MRN: [id number] DOB: [birth year] Today's Date: 04/26/2020   History of Present Illness  Pt is a 80 y/o female admitted with L ankle infection following ORIF and is s/p I and D and hardware removal. PMH includes CHF, CAD, and HTN.  Clinical Impression  Pt is s/p surgery above with deficits below. Pt requiring min guard A to stand and transfer to chair using RW. Reports she normally uses knee scooter, so will bring during next session to practice. Pt reports her husband and son are available to assist. Will continue to follow acutely.     Follow Up Recommendations Home health PT;Supervision for mobility/OOB    Equipment Recommendations  None recommended by PT    Recommendations for Other Services       Precautions / Restrictions Precautions Precautions: Fall Restrictions Weight Bearing Restrictions: Yes LLE Weight Bearing: Non weight bearing      Mobility  Bed Mobility Overal bed mobility: Needs Assistance Bed Mobility: Supine to Sit     Supine to sit: Min guard     General bed mobility comments: Min guard for safety.    Transfers Overall transfer level: Needs assistance Equipment used: Rolling walker (2 wheeled) Transfers: Sit to/from [employer] Sit to Stand: Min guard Stand pivot transfers: Min guard       General transfer comment: Min guard for safety to stand and transfer to recliner. No LOB noted. Cues for sequencing.  Ambulation/Gait                Stairs            Wheelchair Mobility    Modified Rankin (Stroke Patients Only)       Balance Overall balance assessment: Needs assistance Sitting-balance support: No upper extremity supported;Feet supported Sitting balance-Leahy Scale: Good     Standing balance support: Bilateral upper extremity supported;During functional activity Standing balance-Leahy Scale: Poor Standing balance comment: Reliant on BUE  support                             Pertinent Vitals/Pain Pain Assessment: Faces Faces Pain Scale: Hurts little more Pain Location: L ankle Pain Descriptors / Indicators: Sore Pain Intervention(s): Limited activity within patient's tolerance;Monitored during session;Repositioned    Home Living Family/patient expects to be discharged to:: Private residence Living Arrangements: Spouse/significant other Available Help at Discharge: Family;Available 24 hours/day Type of Home: House Home Access: Stairs to enter   [employer] of Steps: 3 Home Layout: One level Home Equipment: Walker - 2 wheels;Other (comment);Grab bars - tub/shower (knee scooter)      Prior Function Level of Independence: Independent with assistive device(s)         Comments: Was using knee scooter for mobility     Hand Dominance        Extremity/Trunk Assessment   Upper Extremity Assessment Upper Extremity Assessment: Defer to OT evaluation    Lower Extremity Assessment Lower Extremity Assessment: LLE deficits/detail LLE Deficits / Details: Deficits consistent with post op pain and weakness. LLE in splint    Cervical / Trunk Assessment Cervical / Trunk Assessment: Normal  Communication   Communication: No difficulties  Cognition Arousal/Alertness: Awake/alert Behavior During Therapy: WFL for tasks assessed/performed Overall Cognitive Status: Within Functional Limits for tasks assessed  General Comments      Exercises     Assessment/Plan    PT Assessment Patient needs continued PT services  PT Problem List Decreased strength;Decreased mobility;Decreased balance;Decreased activity tolerance;Pain       PT Treatment Interventions DME instruction;Gait training;Therapeutic activities;Functional mobility training;Balance training;Therapeutic exercise;Stair training;Patient/family education    PT Goals (Current goals  can be found in the Care Plan section)  Acute Rehab PT Goals Patient Stated Goal: to go home PT Goal Formulation: With patient Time For Goal Achievement: 05/10/20 Potential to Achieve Goals: Good    Frequency Min 5X/week   Barriers to discharge        Co-evaluation               AM-PAC PT "6 Clicks" Mobility  Outcome Measure Help needed turning from your back to your side while in a flat bed without using bedrails?: A Little Help needed moving from lying on your back to sitting on the side of a flat bed without using bedrails?: A Little Help needed moving to and from a bed to a chair (including a wheelchair)?: A Little Help needed standing up from a chair using your arms (e.g., wheelchair or bedside chair)?: A Little Help needed to walk in hospital room?: A Little Help needed climbing 3-5 steps with a railing? : A Lot 6 Click Score: 17    End of Session Equipment Utilized During Treatment: Gait belt Activity Tolerance: Patient tolerated treatment well Patient left: in chair;with call bell/phone within reach;with family/visitor present Nurse Communication: Mobility status;Patient requests pain meds PT Visit Diagnosis: Unsteadiness on feet (R26.81);Muscle weakness (generalized) (M62.81)    Time: 3267-1245 PT Time Calculation (min) (ACUTE ONLY): 34 min   Charges:   PT Evaluation $PT Eval Low Complexity: 1 Low PT Treatments $Therapeutic Activity: 8-22 mins        Cindee Salt, DPT  Acute Rehabilitation Services  Pager: [phone] Office: [phone]   Lehman Prom 04/26/2020, 5:00 PM

## 2020-04-26 NOTE — Op Note (Signed)
NAMEDEETTE, REVAK MEDICAL RECORD RW:[id number] ACCOUNT [id number] DATE OF BIRTH:07/26/1940 FACILITY: [facility] LOCATION: [facility] PHYSICIAN:Wilhemina Grall Diamantina Providence, MD  OPERATIVE REPORT  DATE OF PROCEDURE:    PREOPERATIVE DIAGNOSIS:  Left ankle infection.  POSTOPERATIVE DIAGNOSIS:  Left ankle infection.  PROCEDURE:  Irrigation and excisional debridement of left ankle with removal of hardware.  SURGEON:  Cammy Copa, MD  ASSISTANT:  Karenann Cai, PA.  INDICATIONS:  The patient is a 80 year old patient with left ankle infection, now about 5-1/2 weeks out open reduction and internal fixation of ankle fracture.  The patient was noted to have slow healing incision and no fluctuance, but incision opened  enough up distally laterally over about 1 cm area that plate was palpable.  She presents now for plate removal and excisional debridement.  PROCEDURE IN DETAIL:  The patient was brought to the operating room where general anesthetic was induced.  Preoperative antibiotics were administered.  Timeout was called.  Left ankle was prescrubbed with alcohol and Betadine, allowed to air dry,   prepped with DuraPrep solution and draped in a sterile manner.  Ankle Esmarch was utilized for approximately 15 minutes.  The prior lateral incision was utilized.  Skin and subcutaneous tissue were sharply divided.  The patient did have a fistula  measuring about 1 cm x 5 mm just over the lateral malleolus.  There was some fistula type tissue down to the plate.  This was excised sharply using a knife.  Next, the plate was removed.  Bone quality was poor.  Osteomyelitis could not be definitely  ruled in or ruled out due to the poor bone quality in and around the fracture site as well as the lateral malleolus.  Screws were removed.  Curettage was performed for further excisional debridement of the bone primarily proximally.  Distally, a rongeur  was utilized.  Excellent bleeding was encountered.  Ankle Esmarch  released.  Thorough irrigation performed with 3 liters of irrigating solution and the incision was then closed using 2-0 nylon far-near-near-far sutures.  A good closure was achieved.   Pants and vest wound VAC was applied with good seal obtained.  Supplemental stirrup splint was applied.  All in all, the fracture was stable.  Medial incision was intact with no fluctuance or drainage.  Next, the patient was transferred to the recovery  room in stable condition.  Plan is for infectious disease consult.  Luke's assistance was required for opening, closing, mobilization of tissue.  His assistance was a medical necessity.  Cultures were sent x2 of tissue and bone just beneath the plate.  IN/NUANCE  D:04/26/2020 T:04/26/2020 JOB:[id number]

## 2020-04-26 NOTE — Progress Notes (Signed)
Peripherally Inserted Central Catheter Placement  The IV Nurse has discussed with the patient and/or persons authorized to consent for the patient, the purpose of this procedure and the potential benefits and risks involved with this procedure.  The benefits include less needle sticks, lab draws from the catheter, and the patient may be discharged home with the catheter. Risks include, but not limited to, infection, bleeding, blood clot (thrombus formation), and puncture of an artery; nerve damage and irregular heartbeat and possibility to perform a PICC exchange if needed/ordered by physician.  Alternatives to this procedure were also discussed.  Bard Power PICC patient education guide, fact sheet on infection prevention and patient information card has been provided to patient /or left at bedside.    PICC Placement Documentation  PICC Single Lumen 04/26/20 PICC Left Brachial 39 cm 0 cm (Active)  Indication for Insertion or Continuance of Line Home intravenous therapies (PICC only) 04/26/20 1854  Exposed Catheter (cm) 0 cm 04/26/20 1854  Site Assessment Clean;Dry;Intact 04/26/20 1854  Line Status Flushed;Saline locked;Blood return noted 04/26/20 1854  Dressing Type Transparent 04/26/20 1854  Dressing Status Clean;Dry;Intact 04/26/20 1854  Antimicrobial disc in place? Yes 04/26/20 1854  Safety Lock Not Applicable 04/26/20 1854  Line Care Connections checked and tightened 04/26/20 1854  Line Adjustment (NICU/IV Team Only) No 04/26/20 1854  Dressing Intervention New dressing 04/26/20 1854  Dressing Change Due 05/03/20 04/26/20 1854       Elliot Dally 04/26/2020, 6:55 PM

## 2020-04-26 NOTE — Anesthesia Procedure Notes (Signed)
Procedure Name: LMA Insertion Date/Time: 04/26/2020 11:14 AM Performed by: Waunita Schooner, RN Pre-anesthesia Checklist: Patient identified, Emergency Drugs available, Suction available, Patient being monitored and Timeout performed Patient Re-evaluated:Patient Re-evaluated prior to induction Oxygen Delivery Method: Circle system utilized Preoxygenation: Pre-oxygenation with 100% oxygen Induction Type: IV induction LMA: LMA inserted LMA Size: 4.0 Number of attempts: 1

## 2020-04-26 NOTE — Transfer of Care (Signed)
Immediate Anesthesia Transfer of Care Note  Patient: [name]  Procedure(s) Performed: LEFT ANKLE IRRIGATION AND DEBRIDEMENT, HARDWARE REMOVAL (Left Ankle)  Patient Location: PACU  Anesthesia Type:General  Level of Consciousness: awake and drowsy  Airway & Oxygen Therapy: Patient Spontanous Breathing and Patient connected to nasal cannula oxygen  Post-op Assessment: Report given to RN and Post -op Vital signs reviewed and stable  Post vital signs: Reviewed and stable  Last Vitals:  Vitals Value Taken Time  BP 141/64 04/26/20 1243  Temp    Pulse 69 04/26/20 1244  Resp 14 04/26/20 1244  SpO2 95 % 04/26/20 1244  Vitals shown include unvalidated device data.  Last Pain:  Vitals:   04/26/20 0914  PainSc: 0-No pain      Patients Stated Pain Goal: 3 (04/26/20 0914)  Complications: No complications documented.

## 2020-04-26 NOTE — Brief Op Note (Signed)
   04/26/2020  12:36 PM  PATIENT:  [name]  80 y.o. female  PRE-OPERATIVE DIAGNOSIS:  left anke infection  POST-OPERATIVE DIAGNOSIS:  left anke infection  PROCEDURE:  Procedure(s): LEFT ANKLE IRRIGATION AND  Excisional DEBRIDEMENT, HARDWARE REMOVAL  SURGEON:  Surgeon(s): August Saucer, Corrie Mckusick, MD  ASSISTANT:magnant pa  ANESTHESIA:   general  EBL: 25 ml    Total I/O In: 500 [I.V.:400; IV Piggyback:100] Out: 10 [Blood:10]  BLOOD ADMINISTERED: none  DRAINS: wound vac x 1    LOCAL MEDICATIONS USED:  Marcaine plain 30 cc  SPECIMEN:  cxs x 2   COUNTS:  YES  TOURNIQUET:  * No tourniquets in log *  DICTATION: .Other Dictation: Dictation Number [id number]  PLAN OF CARE: Admit for overnight observation  PATIENT DISPOSITION:  PACU - hemodynamically stable

## 2020-04-27 ENCOUNTER — Encounter (HOSPITAL_COMMUNITY): Payer: Self-pay | Admitting: Orthopedic Surgery

## 2020-04-27 DIAGNOSIS — I2510 Atherosclerotic heart disease of native coronary artery without angina pectoris: Secondary | ICD-10-CM

## 2020-04-27 DIAGNOSIS — B9561 Methicillin susceptible Staphylococcus aureus infection as the cause of diseases classified elsewhere: Secondary | ICD-10-CM

## 2020-04-27 DIAGNOSIS — T8469XA Infection and inflammatory reaction due to internal fixation device of other site, initial encounter: Secondary | ICD-10-CM

## 2020-04-27 DIAGNOSIS — I509 Heart failure, unspecified: Secondary | ICD-10-CM

## 2020-04-27 DIAGNOSIS — T847XXA Infection and inflammatory reaction due to other internal orthopedic prosthetic devices, implants and grafts, initial encounter: Secondary | ICD-10-CM | POA: Diagnosis not present

## 2020-04-27 LAB — HIV ANTIBODY (ROUTINE TESTING W REFLEX): HIV Screen 4th Generation wRfx: NONREACTIVE

## 2020-04-27 LAB — CBC
HCT: 36.6 % (ref 36.0–46.0)
Hemoglobin: 11.9 g/dL — ABNORMAL LOW (ref 12.0–15.0)
MCH: 30.9 pg (ref 26.0–34.0)
MCHC: 32.5 g/dL (ref 30.0–36.0)
MCV: 95.1 fL (ref 80.0–100.0)
Platelets: 278 10*3/uL (ref 150–400)
RBC: 3.85 MIL/uL — ABNORMAL LOW (ref 3.87–5.11)
RDW: 12.4 % (ref 11.5–15.5)
WBC: 6.3 10*3/uL (ref 4.0–10.5)
nRBC: 0 % (ref 0.0–0.2)

## 2020-04-27 LAB — HEPATITIS C ANTIBODY: HCV Ab: NONREACTIVE

## 2020-04-27 LAB — C-REACTIVE PROTEIN: CRP: 1.2 mg/dL — ABNORMAL HIGH (ref <1.0)

## 2020-04-27 LAB — SEDIMENTATION RATE: Sed Rate: 40 mm/hr — ABNORMAL HIGH (ref 0–22)

## 2020-04-27 MED ORDER — DAPTOMYCIN IV (FOR PTA / DISCHARGE USE ONLY): 700.0000 mg | INTRAVENOUS | 0 refills | Status: AC

## 2020-04-27 MED ORDER — RIVAROXABAN 15 MG PO TABS
15.0000 mg | ORAL_TABLET | Freq: Every day | ORAL | Status: DC
Start: 2020-04-27 — End: 2020-04-27
  Administered 2020-04-27: 15 mg via ORAL
  Filled 2020-04-27: qty 1

## 2020-04-27 MED ORDER — POTASSIUM CHLORIDE CRYS ER 20 MEQ PO TBCR
40.0000 meq | EXTENDED_RELEASE_TABLET | Freq: Once | ORAL | Status: AC
  Administered 2020-04-27: 40 meq via ORAL
  Filled 2020-04-27: qty 2

## 2020-04-27 MED ORDER — HEPARIN SOD (PORK) LOCK FLUSH 100 UNIT/ML IV SOLN
250.0000 [IU] | INTRAVENOUS | Status: AC | PRN
  Administered 2020-04-27: 250 [IU]
  Filled 2020-04-27: qty 2.5

## 2020-04-27 MED ORDER — CEFTRIAXONE IV (FOR PTA / DISCHARGE USE ONLY): 2.0000 g | INTRAVENOUS | 0 refills | Status: DC

## 2020-04-27 MED ORDER — RIVAROXABAN 20 MG PO TABS
20.0000 mg | ORAL_TABLET | Freq: Every day | ORAL | Status: DC
  Filled 2020-04-27: qty 1

## 2020-04-27 MED ORDER — HYDROCODONE-ACETAMINOPHEN 5-325 MG PO TABS: 1.0000 | ORAL_TABLET | ORAL | 0 refills | Status: AC | PRN

## 2020-04-27 NOTE — Plan of Care (Signed)
Patient alert and oriented, with no c/o pain at time of discharged. Patient and spouse stated understanding of all instructions given. All belongings with patient and spouse. Patient has an appointment with Dr. August Saucer on January 31st

## 2020-04-27 NOTE — Progress Notes (Signed)
  Subjective: Patient is s/p left ankle I&D. Doing well. Ankle pain controlled overall. Did well in PT.  Patient states she is ready for discharge home.   Objective: Vital signs in last 24 hours: Temp:  [97.9 F (36.6 C)-98.4 F (36.9 C)] 97.9 F (36.6 C) (01/25 1147) Pulse Rate:  [70-79] 70 (01/25 1147) Resp:  [18] 18 (01/25 1147) BP: (111-139)/(55-72) 111/55 (01/25 1147) SpO2:  [94 %-98 %] 98 % (01/25 1147)  Intake/Output from previous day: 01/24 0701 - 01/25 0700 In: 500 [I.V.:400; IV Piggyback:100] Out: 10 [Blood:10] Intake/Output this shift: No intake/output data recorded.  Exam:  No significant drainage in wound vac cannister Toes warm and well-perfused Postop splint in place  Labs: Recent Labs    04/26/20 0828 04/27/20 0549  HGB 12.9 11.9*   Recent Labs    04/26/20 0828 04/27/20 0549  WBC 8.0 6.3  RBC 4.36 3.85*  HCT 42.2 36.6  PLT 347 278   Recent Labs    04/26/20 0828  NA 138  K 3.6  CL 102  CO2 27  BUN 16  CREATININE 0.93  GLUCOSE 106*  CALCIUM 9.3   No results for input(s): LABPT, INR in the last 72 hours.  Assessment/Plan: Picc line in place for home IV abx. Plan for discharge home today.  F/u with Dr. August Saucer in ~ 10 days.  She has follow-up with infectious disease as well.  NWB on operative extremity.  Remove splint at first postop.  Appreciate ID recommendations  Julieanne Cotton 04/27/2020, 3:12 PM

## 2020-04-27 NOTE — Progress Notes (Signed)
   INFECTIOUS DISEASE ATTENDING ADDENDUM:   This patient has been seen and discussed with the house staff. Please see the resident's note for complete details. I have reviewed the pertinent  laboratory and radiographic data and examined the patient independently.  I concur with their findings with the following additions/corrections:  She is anxious about her ability to give antibiotics at home.  Patient is growing Staphylococcus Aureus form cultures.   She does NOT want to be doing cefazolin q 8 hours if this turns out to be MSSA  We will therefore plan on DC on daptomycin alone which will cover MRSA should this be the pathogen, AND cover MSSA without being broad spectrum and posing risk of CDI with Ceftriaxone and be a once a day regimen

## 2020-04-27 NOTE — Progress Notes (Signed)
PHARMACY CONSULT NOTE FOR:  OUTPATIENT  PARENTERAL ANTIBIOTIC THERAPY (OPAT)  Indication: Osteomyelitis  Regimen: Daptomycin 700 mg IV q24h  End date: 06/07/2020  IV antibiotic discharge orders are pended. To discharging provider:  please sign these orders via discharge navigator,  Select New Orders & click on the button choice - Manage This Unsigned Work.     Thank you for allowing pharmacy to be a part of this patient's care.  [name] 04/27/2020, 2:12 PM

## 2020-04-27 NOTE — Progress Notes (Signed)
Physical Therapy Treatment Patient Details Name: [name] MRN: [id number] DOB: 1941-04-03 Today's Date: 04/27/2020    History of Present Illness Pt is a 80 y/o female admitted with L ankle infection following ORIF and is s/p I and D and hardware removal. PMH includes CHF, CAD, and HTN.    PT Comments    Pt progressing well with mobility.  Practiced mobility with the knee scooter and pt demonstrated good management of equipment throughout the unit and was able to negotiate turns well. Initiated HEP this session and provided handout. Will continue to follow and progress as able per POC.    Follow Up Recommendations  Home health PT;Supervision for mobility/OOB     Equipment Recommendations  None recommended by PT    Recommendations for Other Services       Precautions / Restrictions Precautions Precautions: Fall; Wound VAC Restrictions Weight Bearing Restrictions: Yes LLE Weight Bearing: Non weight bearing    Mobility  Bed Mobility Overal bed mobility: Needs Assistance Bed Mobility: Supine to Sit     Supine to sit: Supervision     General bed mobility comments: Light supervision for safety as pt transitioned to/from EOB. No assist.  Transfers Overall transfer level: Needs assistance Equipment used: Rolling walker (2 wheeled) Transfers: Sit to/from [employer] Sit to Stand: Min guard Stand pivot transfers: Min guard       General transfer comment: Min guard for safety to stand and transfer to/from knee scooter. No LOB noted. Cues for sequencing.  Ambulation/Gait                 Stairs             Other Mobility Knee Scooter Mobility: Yes  Needs assistance Distance: 400 Knee Scooter Assistance Details (indicate cue type and reason): Pt was able to negotiate the knee scooter with supervision for safety. Min guard assist for safety over thresholds (bumps) in the hallway. VC's for increased glute med activation on the L for hip  stability on knee scooter.   Modified Rankin (Stroke Patients Only)        Balance Overall balance assessment: Needs assistance Sitting-balance support: No upper extremity supported;Feet supported Sitting balance-Leahy Scale: Good     Standing balance support: Bilateral upper extremity supported;During functional activity Standing balance-Leahy Scale: Poor Standing balance comment: Reliant on BUE support                            Cognition Arousal/Alertness: Awake/alert Behavior During Therapy: WFL for tasks assessed/performed Overall Cognitive Status: Within Functional Limits for tasks assessed                                        Exercises General Exercises - Lower Extremity Ankle Circles/Pumps: 10 reps (toe wiggles) Quad Sets: 10 reps Long Arc Quad: 20 reps Heel Slides: 10 reps Hip ABduction/ADduction: 10 reps Straight Leg Raises: 10 reps    General Comments        Pertinent Vitals/Pain Pain Assessment: Faces Faces Pain Scale: Hurts little more Pain Location: L ankle Pain Descriptors / Indicators: Sore Pain Intervention(s): Limited activity within patient's tolerance;Monitored during session;Repositioned    Home Living Family/patient expects to be discharged to:: Private residence Living Arrangements: Spouse/significant other                  Prior Function  PT Goals (current goals can now be found in the care plan section) Acute Rehab PT Goals Patient Stated Goal: to go home PT Goal Formulation: With patient/family Time For Goal Achievement: 05/10/20 Potential to Achieve Goals: Good Progress towards PT goals: Progressing toward goals    Frequency    Min 5X/week      PT Plan Current plan remains appropriate    Co-evaluation              AM-PAC PT "6 Clicks" Mobility   Outcome Measure  Help needed turning from your back to your side while in a flat bed without using bedrails?: None Help  needed moving from lying on your back to sitting on the side of a flat bed without using bedrails?: None Help needed moving to and from a bed to a chair (including a wheelchair)?: A Little Help needed standing up from a chair using your arms (e.g., wheelchair or bedside chair)?: A Little Help needed to walk in hospital room?: A Little Help needed climbing 3-5 steps with a railing? : A Lot 6 Click Score: 19    End of Session Equipment Utilized During Treatment: Gait belt Activity Tolerance: Patient tolerated treatment well Patient left: in bed;with call bell/phone within reach;with family/visitor present Nurse Communication: Mobility status PT Visit Diagnosis: Unsteadiness on feet (R26.81);Muscle weakness (generalized) (M62.81)     Time: 1696-7893 PT Time Calculation (min) (ACUTE ONLY): 41 min  Charges:  $Gait Training: 23-37 mins $Therapeutic Exercise: 8-22 mins                     Conni Slipper, PT, DPT Acute Rehabilitation Services Pager: [phone] Office: [phone]    Marylynn Pearson 04/27/2020, 11:28 AM

## 2020-04-27 NOTE — TOC Initial Note (Addendum)
Transition of Care [facility]) - Initial/Assessment Note    Patient Details  Name: [name] MRN: [id number] Date of Birth: 12-05-1940  Transition of Care [facility]) CM/SW Contact:    Mearl Latin, LCSW Phone Number: 04/27/2020, 10:15 AM  Clinical Narrative:                 CSW received consult for possible home health services at time of discharge and for IV antibiotics a thome. CSW spoke with patient and her spouse at bedside. Patient reported that she would like home health services.  CSW sent referral for review with Frances Furbish and was accepted. [facility]) assisting with home IV infusion teaching and set up. Patient has been staying with her son but lives in [place]: [address], [place]. Her cell is: [phone]. She expressed being anxious about needing someone to come to the house everyday to administer the IV antibiotic. CSW explained that likely insurance will not pay for that, but will ask.    Expected Discharge Plan: Home w Home Health Services Barriers to Discharge: No Barriers Identified   Patient Goals and CMS Choice Patient states their goals for this hospitalization and ongoing recovery are:: Return home CMS Medicare.gov Compare Post Acute Care list provided to:: Patient Choice offered to / list presented to : [facility]  Expected Discharge Plan and Services Expected Discharge Plan: Home w Home Health Services   Discharge Planning Services: CM Consult Post Acute Care Choice: Home Health,Durable Medical Equipment Living arrangements for the past 2 months: Single Family Home                 DME Arranged: IV pump/equipment DME Agency:  [facility]) Date DME Agency Contacted: 04/27/20 Time DME Agency Contacted: [phone] Representative spoke with at DME Agency: Pam HH Arranged: RN,PT HH Agency: [facility] Date [facility] Agency Contacted: 04/27/20 Time HH Agency Contacted: 1015 Representative spoke with at [facility] Agency: Denyse Amass  Prior Living  Arrangements/Services Living arrangements for the past 2 months: Single Family Home Lives with:: Spouse,Adult Children Patient language and need for interpreter reviewed:: Yes Do you feel safe going back to the place where you live?: Yes      Need for Family Participation in Patient Care: Yes (Comment) Care giver support system in place?: Yes (comment)   Criminal Activity/Legal Involvement Pertinent to Current Situation/Hospitalization: No - Comment as needed  Activities of Daily Living Home Assistive Devices/Equipment: Eyeglasses ADL Screening (condition at time of admission) Patient's cognitive ability adequate to safely complete daily activities?: Yes Is the patient deaf or have difficulty hearing?: No Does the patient have difficulty seeing, even when wearing glasses/contacts?: No Does the patient have difficulty concentrating, remembering, or making decisions?: No Patient able to express need for assistance with ADLs?: Yes Does the patient have difficulty dressing or bathing?: Yes Independently performs ADLs?: No Communication: Independent Dressing (OT): Needs assistance Is this a change from baseline?: Change from baseline, expected to last <3days Grooming: Independent Feeding: Independent Bathing: Needs assistance Is this a change from baseline?: Change from baseline, expected to last <3 days Toileting: Needs assistance Is this a change from baseline?: Change from baseline, expected to last <3 days In/Out Bed: Needs assistance Is this a change from baseline?: Change from baseline, expected to last <3 days Does the patient have difficulty walking or climbing stairs?: Yes Weakness of Legs: Left Weakness of Arms/Hands: None  Permission Sought/Granted Permission sought to share information with : Facility Contact Representative,Family Supports Permission granted to share information with :  Yes, Verbal Permission Granted     Permission granted to share info w AGENCY: HH         Emotional Assessment Appearance:: Appears stated age Attitude/Demeanor/Rapport: Engaged,Gracious Affect (typically observed): Accepting,Appropriate,Anxious Orientation: : Oriented to Self,Oriented to Place,Oriented to  Time,Oriented to Situation Alcohol / Substance Use: Not Applicable Psych Involvement: No (comment)  Admission diagnosis:  Bone infection of left ankle [facility]) [M86.9] Patient Active Problem List   Diagnosis Date Noted  . 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 Congestive heart failure (HCC)   . Coronary artery disease involving native heart without angina pectoris   . Hardware complicating wound infection [facility])    PCP:  System, Provider Not In Pharmacy:   [id number] [place], [place] - [address] AT [facility] [address] [facility] [address] [place] [address] Phone: [phone] Fax: [phone]     Social Determinants of Health (SDOH) Interventions    Readmission Risk Interventions No flowsheet data found.

## 2020-04-27 NOTE — Care Management Obs Status (Signed)
MEDICARE OBSERVATION STATUS NOTIFICATION   Patient Details  Name: [name] MRN: [id number] Date of Birth: Jan 25, 1941   Medicare Observation Status Notification Given:  Yes    Mearl Latin, LCSW 04/27/2020, 9:16 AM

## 2020-04-27 NOTE — Anesthesia Postprocedure Evaluation (Signed)
Anesthesia Post Note  Patient: [name]  Procedure(s) Performed: LEFT ANKLE IRRIGATION AND DEBRIDEMENT, HARDWARE REMOVAL (Left Ankle)     Patient location during evaluation: PACU Anesthesia Type: General Level of consciousness: awake and alert Pain management: pain level controlled Vital Signs Assessment: post-procedure vital signs reviewed and stable Respiratory status: spontaneous breathing, nonlabored ventilation and respiratory function stable Cardiovascular status: blood pressure returned to baseline and stable Postop Assessment: no apparent nausea or vomiting Anesthetic complications: no   No complications documented.  Last Vitals:  Vitals:   04/27/20 0414 04/27/20 0732  BP: 127/70 123/68  Pulse: 74 71  Resp: 18 18  Temp: 36.7 C 36.7 C  SpO2: 96% 96%                  Beryle Lathe

## 2020-04-27 NOTE — Progress Notes (Signed)
Subjective: No new complaints this morning. Patient remains afebrile. Patient is anxious to go home. States she would like home health to initially come to her antibiotic treatments as she does not feel like her family is comfortable enough to do it themselves.   Antibiotics:  Anti-infectives (From admission, onward)   Start     Dose/Rate Route Frequency Ordered Stop   04/27/20 1200  cefTRIAXone (ROCEPHIN) 2 g in sodium chloride 0.9 % 100 mL IVPB        2 g 200 mL/hr over 30 Minutes Intravenous Every 24 hours 04/26/20 1514     04/27/20 0000  cefTRIAXone (ROCEPHIN) IVPB        2 g Intravenous Every 24 hours 04/27/20 1137 06/08/20 2359   04/27/20 0000  daptomycin (CUBICIN) IVPB        700 mg Intravenous Every 24 hours 04/27/20 1137 06/08/20 2359   04/26/20 2000  ceFAZolin (ANCEF) IVPB 1 g/50 mL premix  Status:  Discontinued        1 g 100 mL/hr over 30 Minutes Intravenous Every 8 hours 04/26/20 1337 04/26/20 1443   04/26/20 1700  DAPTOmycin (CUBICIN) 700 mg in sodium chloride 0.9 % IVPB        700 mg 228 mL/hr over 30 Minutes Intravenous Daily 04/26/20 1514     04/26/20 1530  cefTRIAXone (ROCEPHIN) 2 g in sodium chloride 0.9 % 100 mL IVPB        2 g 200 mL/hr over 30 Minutes Intravenous  Once 04/26/20 1443 04/27/20 0829   04/26/20 0830  ceFAZolin (ANCEF) IVPB 2g/100 mL premix        2 g 200 mL/hr over 30 Minutes Intravenous On call to O.R. 04/26/20 0827 04/26/20 1126      Medications: Scheduled Meds: . atorvastatin  10 mg Oral QHS  . Chlorhexidine Gluconate Cloth  6 each Topical Daily  . docusate sodium  100 mg Oral BID  . Rivaroxaban  15 mg Oral Q supper  . sotalol  80 mg Oral BID   Continuous Infusions: . cefTRIAXone (ROCEPHIN)  IV 2 g (04/27/20 1155)  . DAPTOmycin (CUBICIN)  IV 700 mg (04/26/20 1757)  . methocarbamol (ROBAXIN) IV     PRN Meds:.acetaminophen, HYDROcodone-acetaminophen, methocarbamol **OR** methocarbamol (ROBAXIN) IV, metoCLOPramide **OR**  metoCLOPramide (REGLAN) injection, morphine injection, ondansetron **OR** ondansetron (ZOFRAN) IV, sodium chloride flush    Objective: Weight change:  No intake or output data in the 24 hours ending 04/27/20 1215 Blood pressure (!) 111/55, pulse 70, temperature 97.9 F (36.6 C), temperature source Oral, resp. rate 18, height 5\' 4"  (1.626 m), weight 86.2 kg, SpO2 98 %. Temp:  [97.5 F (36.4 C)-98.4 F (36.9 C)] 97.9 F (36.6 C) (01/25 1147) Pulse Rate:  [64-79] 70 (01/25 1147) Resp:  [13-18] 18 (01/25 1147) BP: (111-141)/(55-74) 111/55 (01/25 1147) SpO2:  [94 %-98 %] 98 % (01/25 1147) FiO2 (%):  [21 %] 21 % (01/24 1239)  Physical Exam: Physical Exam Constitutional:      Appearance: Normal appearance.  HENT:     Head: Normocephalic and atraumatic.  Eyes:     Conjunctiva/sclera: Conjunctivae normal.  Cardiovascular:     Rate and Rhythm: Normal rate and regular rhythm.     Pulses: Normal pulses.     Heart sounds: Normal heart sounds.  Pulmonary:     Effort: Pulmonary effort is normal.     Breath sounds: Normal breath sounds.  Abdominal:     General: Abdomen is  flat. Bowel sounds are normal.     Palpations: Abdomen is soft.     Tenderness: There is no abdominal tenderness.  Musculoskeletal:        General: No swelling. Normal range of motion.     Cervical back: Normal range of motion and neck supple.  Neurological:     Mental Status: She is alert.      CBC:    Component Value Date/Time   WBC 6.3 04/27/2020 0549   RBC 3.85 (L) 04/27/2020 0549   HGB 11.9 (L) 04/27/2020 0549   HCT 36.6 04/27/2020 0549   PLT 278 04/27/2020 0549   MCV 95.1 04/27/2020 0549   MCH 30.9 04/27/2020 0549   MCHC 32.5 04/27/2020 0549   RDW 12.4 04/27/2020 0549   LYMPHSABS 1.5 08/12/2010 1244   MONOABS 0.4 08/12/2010 1244   EOSABS 0.1 08/12/2010 1244   BASOSABS 0.0 08/12/2010 1244    BMET Recent Labs    04/26/20 0828  NA 138  K 3.6  CL 102  CO2 27  GLUCOSE 106*  BUN 16   CREATININE 0.93  CALCIUM 9.3   Liver Panel  No results for input(s): PROT, ALBUMIN, AST, ALT, ALKPHOS, BILITOT, BILIDIR, IBILI in the last 72 hours.  Sedimentation Rate Recent Labs    04/27/20 0549  ESRSEDRATE 40*   C-Reactive Protein Recent Labs    04/27/20 0549  CRP 1.2*    Micro Results: Recent Results (from the past 720 hour(s))  SARS CORONAVIRUS 2 (TAT 6-24 HRS) Nasopharyngeal Nasopharyngeal Swab     Status: None   Collection Time: 04/22/20 11:05 AM   Specimen: Nasopharyngeal Swab  Result Value Ref Range Status   SARS Coronavirus 2 NEGATIVE NEGATIVE Final    Comment: (NOTE) SARS-CoV-2 target nucleic acids are NOT DETECTED.  The SARS-CoV-2 RNA is generally detectable in upper and lower respiratory specimens during the acute phase of infection. Negative results do not preclude SARS-CoV-2 infection, do not rule out co-infections with other pathogens, and should not be used as the sole basis for treatment or other patient management decisions. Negative results must be combined with clinical observations, patient history, and epidemiological information. The expected result is Negative.  Fact Sheet for Patients: HairSlick.no  Fact Sheet for Healthcare Providers: quierodirigir.com  This test is not yet approved or cleared by the [place] FDA and  has been authorized for detection and/or diagnosis of SARS-CoV-2 by FDA under an Emergency Use Authorization (EUA). This EUA will remain  in effect (meaning this test can be used) for the duration of the COVID-19 declaration under Se ction 564(b)(1) of the Act, 21 U.S.C. section 360bbb-3(b)(1), unless the authorization is terminated or revoked sooner.  Performed at [facility], [address] [place], [place] [address]   Surgical pcr screen     Status: None   Collection Time: 04/26/20  8:28 AM   Specimen: Nasal Mucosa; Nasal Swab  Result Value Ref  Range Status   MRSA, PCR NEGATIVE NEGATIVE Final   Staphylococcus aureus NEGATIVE NEGATIVE Final    Comment: (NOTE) The Xpert SA Assay (FDA approved for NASAL specimens in patients 39 years of age and older), is one component of a comprehensive surveillance program. It is not intended to diagnose infection nor to guide or monitor treatment. Performed at [facility], [address] [place], [place] [address]   Aerobic/Anaerobic Culture (surgical/deep wound)     Status: None (Preliminary result)   Collection Time: 04/26/20 11:24 AM   Specimen:  PATH Soft tissue  Result Value Ref Range Status   Specimen Description TISSUE  Final   Special Requests LEFT ANKLE NEAR HARDWARE SPEC A  Final   Gram Stain   Final    RARE WBC PRESENT, PREDOMINANTLY MONONUCLEAR NO ORGANISMS SEEN    Culture   Final    CULTURE REINCUBATED FOR BETTER GROWTH Performed at [facility], [address], [place], [place] [address]    Report Status PENDING  Incomplete    Studies/Results: DG MINI C-ARM IMAGE ONLY  Result Date: 04/26/2020 There is no interpretation for this exam.  This order is for images obtained during a surgical procedure.  Please See "Surgeries" Tab for more information regarding the procedure.   [place] EKG SITE RITE  Result Date: 04/26/2020 If Site Rite image not attached, placement could not be confirmed due to current cardiac rhythm.   Assessment/Plan:  INTERVAL HISTORY: PICC line placed this morning, HIV and hep C labs wnl.  Mildly elevated inflammatory markers  Active Problems:   Bone infection of left ankle (HCC)   Congestive heart failure (HCC)   Coronary artery disease involving native heart without angina pectoris   Hardware complicating wound infection (HCC)   [name] is a 80 y.o. female with CHF, CAD, hyperlipidemia, HTN and ankle fracture status post ORIF and now status post I&D and removal of infected hardware. Patient is doing well clinically.  Due to  risk of osteo with infected hardware, will treat with IV antibiotics for 6 weeks.  Tissue/deep wound culture now growing rare Staph aureus.  Pending sensitivity.  Patient cleared for discharge today.  We will go home with IV daptomycin and ceftriaxone for 6 weeks.  Antibiotics regimen will be modified when the sensitivity comes back.  Patient going home with home health.  Will need weekly labs while on IV antibiotics.  Scheduled to follow-up with Dr. Daiva Eves on 05/26/2020   LOS: 0 days   Steffanie Rainwater 04/27/2020, 12:15 PM

## 2020-04-28 LAB — ANAEROBIC AND AEROBIC CULTURE
MICRO NUMBER:: 11438549
MICRO NUMBER:: 11438550
SPECIMEN QUALITY:: ADEQUATE
SPECIMEN QUALITY:: ADEQUATE

## 2020-04-29 ENCOUNTER — Telehealth: Payer: Self-pay

## 2020-04-29 NOTE — Telephone Encounter (Signed)
[name] is requesting verbal orders Once a week for 7 weeks for pick line management care   [name] - [phone]

## 2020-05-01 LAB — AEROBIC/ANAEROBIC CULTURE W GRAM STAIN (SURGICAL/DEEP WOUND)

## 2020-05-03 ENCOUNTER — Telehealth: Payer: Self-pay | Admitting: *Deleted

## 2020-05-03 ENCOUNTER — Ambulatory Visit (INDEPENDENT_AMBULATORY_CARE_PROVIDER_SITE_OTHER): Payer: Medicare Other | Admitting: Orthopedic Surgery

## 2020-05-03 ENCOUNTER — Other Ambulatory Visit: Payer: Self-pay

## 2020-05-03 DIAGNOSIS — S82892G Other fracture of left lower leg, subsequent encounter for closed fracture with delayed healing: Secondary | ICD-10-CM

## 2020-05-03 NOTE — Telephone Encounter (Signed)
I know this patient well. Very sweet lady who had foot fracture sp hardware and now MSSA infection. She is on dapto because she didn't want multiple doses of abx throughout the day. 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 I agree with another visit later this week

## 2020-05-03 NOTE — Telephone Encounter (Signed)
Patient called to report bleeding at her PICC site. She is on xarelto, PICC was placed 7 days ago.   Last night it bled through her PICC dressing, so she called [facility] nurse on call who came to the house. Per patient, nurse assessed the site, said it looked fine and changed the bandage.  Patient has been keeping the PICC site covered with a sock since the nurse came last night.   New [facility]urbish nurse on site this afternoon to assess PICC and draw labs. Per this nurse, PICC site still looks good. No pain, no heat, no swelling.  Only some serosanguinous drainage since new bandage was placed, but the biopatch disk is not soaked through. PICC draws and flushes well.  Frances Furbish will request PRN nurse visit this week to change the dressing and biopatch, but would like to hold off PICC dressing change today. Patient's daughter will get burn netting for patient to use to keep the PICC from being accidentally pulled or tugged, especially while dressing or sleeping.  Patient will go to ER if the site starts bleeding again and she is unable to stop it.  She will notify Frances Furbish if the dressing becomes soaked with blood again. Frances Furbish will notify us if other issues develop with PICC, including trouble infusing or flushing, pain, or change in drainage at insertion.  Patient follows up 05/26/20 with Dr Lennox Laity, Zachary George, RN

## 2020-05-05 ENCOUNTER — Telehealth: Payer: Self-pay | Admitting: Infectious Diseases

## 2020-05-05 NOTE — Telephone Encounter (Signed)
Called and checked in bleeding - this has resolved with dressing changes.   Her CK baseline was only 21 --> now up to 304 early in on therapy. Will have her hold her statin during therapy. Repeat CK again next Monday.   She understands this is a temporary change for her medication.    Rexene Alberts, MSN, NP-C [facility] for Infectious Disease [facility]  [email or web address] Pager: [phone] Office: [phone] [facility] Main Line: [phone]

## 2020-05-05 NOTE — Telephone Encounter (Signed)
Thanks Lyons, I thought she was already holding but maybe only did as an inpatient

## 2020-05-06 ENCOUNTER — Encounter: Payer: Self-pay | Admitting: Orthopedic Surgery

## 2020-05-06 NOTE — Progress Notes (Signed)
   Post-Op Visit Note   Patient: [name]           Date of Birth: 04-16-1940           MRN: [id number] Visit Date: 05/03/2020 PCP: System, Provider Not In   Assessment & Plan:  Chief Complaint:  Chief Complaint  Patient presents with  . Left Ankle - Routine Post Op   Visit Diagnoses:  1. Fracture of malleolus, closed, left, with delayed healing, subsequent encounter     Plan: Bernadett is a patient who is now a week out left ankle I&D.  She has had a circumferential wound VAC in place.  Cultures positive for methicillin sensitive staph.  She is on IV daptomycin.  On exam incision is intact.  2 mm gapping over 1 cm area distally.  Plan is to leave the sutures in today.  Come back in 1 week for clinical recheck and removal of the proximal incision sutures.  Would not necessarily remove this distal incision sutures until 3 weeks postop.  Clinically she has improved in terms of pain relief.  Negative Homans no calf tenderness today.  Follow-Up Instructions: Return in about 1 week (around 05/10/2020).   Orders:  No orders of the defined types were placed in this encounter.  No orders of the defined types were placed in this encounter.   Imaging: No results found.  PMFS History: Patient Active Problem List   Diagnosis Date Noted  . 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 Congestive heart failure (HCC)   . Coronary artery disease involving native heart without angina pectoris   . Hardware complicating wound infection [facility])    Past Medical History:  Diagnosis Date  . CHF (congestive heart failure) (HCC)   . Coronary artery disease   . Dysrhythmia    a-fib  . HLD (hyperlipidemia)   . Hypertension     History reviewed. No pertinent family history.  Past Surgical History:  Procedure Laterality Date  . ATRIAL FIBRILLATION ABLATION    . CHOLECYSTECTOMY    . COLONOSCOPY    . EYE SURGERY Bilateral    cataractions removed  . HARDWARE REMOVAL Left 04/26/2020    Procedure: LEFT ANKLE IRRIGATION AND DEBRIDEMENT, HARDWARE REMOVAL;  Surgeon: Cammy Copa, MD;  Location: [facility] OR;  Service: Orthopedics;  Laterality: Left;  . ORIF ANKLE FRACTURE Left 03/16/2020   Procedure: OPEN REDUCTION INTERNAL FIXATION (ORIF) LEFT ANKLE FRACTURE;  Surgeon: Cammy Copa, MD;  [facility];  Service: Orthopedics;  Laterality: Left;   Social History   Occupational History  . Not on file  Tobacco Use  . Smoking status: Never Smoker  . Smokeless tobacco: Never Used  Vaping Use  . Vaping Use: Never used  Substance and Sexual Activity  . Alcohol use: Yes    Comment: occasional wine  . Drug use: Never  . Sexual activity: Not on file

## 2020-05-10 ENCOUNTER — Encounter: Payer: Self-pay | Admitting: Surgical

## 2020-05-10 ENCOUNTER — Encounter: Payer: Self-pay | Admitting: Infectious Disease

## 2020-05-10 ENCOUNTER — Ambulatory Visit (INDEPENDENT_AMBULATORY_CARE_PROVIDER_SITE_OTHER): Payer: Medicare Other | Admitting: Surgical

## 2020-05-10 ENCOUNTER — Other Ambulatory Visit: Payer: Self-pay

## 2020-05-10 DIAGNOSIS — S82892G Other fracture of left lower leg, subsequent encounter for closed fracture with delayed healing: Secondary | ICD-10-CM

## 2020-05-10 NOTE — Progress Notes (Signed)
   Post-Op Visit Note   Patient: [name]           Date of Birth: 08-12-1940           MRN: [id number] Visit Date: 05/10/2020 PCP: System, Provider Not In   Assessment & Plan:  Chief Complaint:  Chief Complaint  Patient presents with  . Other    F/u ankle    Visit Diagnoses: No diagnosis found.  Plan: Patient is a 80 year old female who presents about 2 weeks out from left ankle I&D.  She reports she is doing well.  Denies any fevers, chills, night sweats, fatigue, drainage from the incision.  She is receiving IV daptomycin through her PICC line.  She is tolerating this well.  Does not have to take anything for pain aside from the occasional Tylenol.  She is taking Xarelto for DVT prophylaxis.  No calf tenderness.  Negative Homans' sign.  Incision is healing well and sutures are all intact.  The proximal sutures were removed and replaced with Steri-Strips.  The distal incisions will be left in until 1 week from today.  She has been using the 5 sock which has helped significantly with swelling as there is no edema.  No expressible drainage from the incision or surrounding cellulitis.  Overall patient is doing well and cautioned her against ankle pump exercises to allow the incision to heal fully.  Follow-up in 1 week.  Continue nonweightbearing operative extremity.      Follow-Up Instructions: No follow-ups on file.   Orders:  No orders of the defined types were placed in this encounter.  No orders of the defined types were placed in this encounter.   Imaging: No results found.  PMFS History: Patient Active Problem List   Diagnosis Date Noted  . 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 Congestive heart failure (HCC)   . Coronary artery disease involving native heart without angina pectoris   . Hardware complicating wound infection [facility])    Past Medical History:  Diagnosis Date  . CHF (congestive heart failure) (HCC)   . Coronary artery disease   .  Dysrhythmia    a-fib  . HLD (hyperlipidemia)   . Hypertension     No family history on file.  Past Surgical History:  Procedure Laterality Date  . ATRIAL FIBRILLATION ABLATION    . CHOLECYSTECTOMY    . COLONOSCOPY    . EYE SURGERY Bilateral    cataractions removed  . HARDWARE REMOVAL Left 04/26/2020   Procedure: LEFT ANKLE IRRIGATION AND DEBRIDEMENT, HARDWARE REMOVAL;  Surgeon: Cammy Copa, MD;  Location: [facility] OR;  Service: Orthopedics;  Laterality: Left;  . ORIF ANKLE FRACTURE Left 03/16/2020   Procedure: OPEN REDUCTION INTERNAL FIXATION (ORIF) LEFT ANKLE FRACTURE;  Surgeon: Cammy Copa, MD;  Location: [facility] OR;  Service: Orthopedics;  Laterality: Left;   Social History   Occupational History  . Not on file  Tobacco Use  . Smoking status: Never Smoker  . Smokeless tobacco: Never Used  Vaping Use  . Vaping Use: Never used  Substance and Sexual Activity  . Alcohol use: Yes    Comment: occasional wine  . Drug use: Never  . Sexual activity: Not on file

## 2020-05-13 NOTE — Discharge Summary (Signed)
Physician Discharge Summary      Patient ID: [name] MRN: [id number] DOB/AGE: 80/22/1942 80 y.o.  Admit date: 04/26/2020 Discharge date: 04/27/20  Admission Diagnoses:  Principal Problem:   Bone infection of left ankle ([place]) Active Problems:   Congestive heart failure (HCC)   Coronary artery disease involving native heart without angina pectoris   Hardware complicating wound infection [facility])   Discharge Diagnoses:  Same  Surgeries: Procedure(s): LEFT ANKLE IRRIGATION AND DEBRIDEMENT, HARDWARE REMOVAL on 04/26/2020   Consultants:   Discharged Condition: Stable  Hospital Course: [name] is an 80 y.o. female who was admitted 04/26/2020 with a chief complaint of left ankle pain, and found to have a diagnosis of left ankle infection.  They were brought to the operating room on 04/26/2020 and underwent the above named procedures.  Pt awoke from anesthesia without complication and was transferred to the floor. On POD1, patient's pain was well controlled.  She did well in PT and she was discharged home on POD1 with PICC line. She will receive IV abx and follow with ID.  Pt will f/u with Dr. Marlou Sa in clinic in ~1 week. Sutures will be left in for an appropriate amount of time to allow wound healing.   Antibiotics given:  Anti-infectives (From admission, onward)   Start     Dose/Rate Route Frequency Ordered Stop   04/27/20 1200  cefTRIAXone (ROCEPHIN) 2 g in sodium chloride 0.9 % 100 mL IVPB  Status:  Discontinued        2 g 200 mL/hr over 30 Minutes Intravenous Every 24 hours 04/26/20 1514 04/27/20 1411   04/27/20 0000  cefTRIAXone (ROCEPHIN) IVPB  Status:  Discontinued        2 g Intravenous Every 24 hours 04/27/20 1137 04/27/20    04/27/20 0000  daptomycin (CUBICIN) IVPB        700 mg Intravenous Every 24 hours 04/27/20 1137 06/08/20 2359   04/26/20 2000  ceFAZolin (ANCEF) IVPB 1 g/50 mL premix  Status:  Discontinued        1 g 100 mL/hr over 30 Minutes Intravenous  Every 8 hours 04/26/20 1337 04/26/20 1443   04/26/20 1700  DAPTOmycin (CUBICIN) 700 mg in sodium chloride 0.9 % IVPB  Status:  Discontinued        700 mg 228 mL/hr over 30 Minutes Intravenous Daily 04/26/20 1514 04/27/20 2342   04/26/20 1530  cefTRIAXone (ROCEPHIN) 2 g in sodium chloride 0.9 % 100 mL IVPB        2 g 200 mL/hr over 30 Minutes Intravenous  Once 04/26/20 1443 04/27/20 0829   04/26/20 0830  ceFAZolin (ANCEF) IVPB 2g/100 mL premix        2 g 200 mL/hr over 30 Minutes Intravenous On call to O.R. 04/26/20 0827 04/26/20 1126    .  Recent vital signs:  Vitals:   04/27/20 0732 04/27/20 1147  BP: 123/68 (!) 111/55  Pulse: 71 70  Resp: 18 18  Temp: 98 F (36.7 C) 97.9 F (36.6 C)  SpO2: 96% 98%    Recent laboratory studies:  Results for orders placed or performed during the hospital encounter of 04/26/20  Surgical pcr screen   Specimen: Nasal Mucosa; Nasal Swab  Result Value Ref Range   MRSA, PCR NEGATIVE NEGATIVE   Staphylococcus aureus NEGATIVE NEGATIVE  Aerobic/Anaerobic Culture (surgical/deep wound)   Specimen: PATH Soft tissue  Result Value Ref Range   Specimen Description TISSUE    Special Requests LEFT ANKLE NEAR HARDWARE [place]  A    Gram Stain      RARE WBC PRESENT, PREDOMINANTLY MONONUCLEAR NO ORGANISMS SEEN    Culture      RARE STAPHYLOCOCCUS AUREUS CRITICAL RESULT CALLED TO, READ BACK BY AND VERIFIED WITH: A,ABRAHAM RN _0  04/27/20 EB NO ANAEROBES ISOLATED Performed at [facility], [address] [place], [place] [address]    Report Status 05/01/2020 FINAL    Organism ID, Bacteria STAPHYLOCOCCUS AUREUS       Susceptibility   Staphylococcus aureus - MIC*    CIPROFLOXACIN <=0.5 SENSITIVE Sensitive     ERYTHROMYCIN <=0.25 SENSITIVE Sensitive     GENTAMICIN <=0.5 SENSITIVE Sensitive     OXACILLIN <=0.25 SENSITIVE Sensitive     TETRACYCLINE <=1 SENSITIVE Sensitive     VANCOMYCIN <=0.5 SENSITIVE Sensitive     TRIMETH/SULFA <=10 SENSITIVE  Sensitive     CLINDAMYCIN <=0.25 SENSITIVE Sensitive     RIFAMPIN <=0.5 SENSITIVE Sensitive     Inducible Clindamycin NEGATIVE Sensitive     * RARE STAPHYLOCOCCUS AUREUS  CBC  Result Value Ref Range   WBC 8.0 4.0 - 10.5 K/uL   RBC 4.36 3.87 - 5.11 MIL/uL   Hemoglobin 12.9 12.0 - 15.0 g/dL   HCT 42.2 36.0 - 46.0 %   MCV 96.8 80.0 - 100.0 fL   MCH 29.6 26.0 - 34.0 pg   MCHC 30.6 30.0 - 36.0 g/dL   RDW 12.2 11.5 - 15.5 %   Platelets 347 150 - 400 K/uL   nRBC 0.0 0.0 - 0.2 %  Basic metabolic panel  Result Value Ref Range   Sodium 138 135 - 145 mmol/L   Potassium 3.6 3.5 - 5.1 mmol/L   Chloride 102 98 - 111 mmol/L   CO2 27 22 - 32 mmol/L   Glucose, Bld 106 (H) 70 - 99 mg/dL   BUN 16 8 - 23 mg/dL   Creatinine, Ser 0.93 0.44 - 1.00 mg/dL   Calcium 9.3 8.9 - 10.3 mg/dL   GFR, Estimated >60 >60 mL/min   Anion gap 9 5 - 15  CK  Result Value Ref Range   Total CK 21 (L) 38 - 234 U/L  CBC  Result Value Ref Range   WBC 6.3 4.0 - 10.5 K/uL   RBC 3.85 (L) 3.87 - 5.11 MIL/uL   Hemoglobin 11.9 (L) 12.0 - 15.0 g/dL   HCT 36.6 36.0 - 46.0 %   MCV 95.1 80.0 - 100.0 fL   MCH 30.9 26.0 - 34.0 pg   MCHC 32.5 30.0 - 36.0 g/dL   RDW 12.4 11.5 - 15.5 %   Platelets 278 150 - 400 K/uL   nRBC 0.0 0.0 - 0.2 %  C-reactive protein  Result Value Ref Range   CRP 1.2 (H) <1.0 mg/dL  Sedimentation rate  Result Value Ref Range   Sed Rate 40 (H) 0 - 22 mm/hr  Hepatitis C antibody  Result Value Ref Range   HCV Ab NON REACTIVE NON REACTIVE  HIV Antibody (routine testing w rflx)  Result Value Ref Range   HIV Screen 4th Generation wRfx Non Reactive Non Reactive    Discharge Medications:   Allergies as of 04/27/2020   No Known Allergies     Medication List    STOP taking these medications   doxycycline 100 MG EC tablet Commonly known as: DORYX     TAKE these medications   atorvastatin 10 MG tablet Commonly known as: LIPITOR Take 10 mg by mouth at bedtime.  calcium carbonate 500 MG  chewable tablet Commonly known as: TUMS - dosed in mg elemental calcium Chew 1-2 tablets by mouth 3 (three) times daily as needed for indigestion or heartburn.   daptomycin  IVPB Commonly known as: CUBICIN Inject 700 mg into the vein daily. Indication:  Osteomyelitis  First Dose: No Last Day of Therapy: 06/07/2020 Labs - Once weekly:  CBC/D, BMP, and CPK Labs - Every other week:  ESR and CRP Method of administration: IV Push Method of administration may be changed at the discretion of home infusion pharmacist based upon assessment of the patient and/or caregiver's ability to self-administer the medication ordered.   HYDROcodone-acetaminophen 5-325 MG tablet Commonly known as: NORCO/VICODIN Take 1 tablet by mouth every 4 (four) hours as needed for moderate pain. What changed: when to take this   methocarbamol 500 MG tablet Commonly known as: Robaxin Take 1 tablet (500 mg total) by mouth every 8 (eight) hours as needed.   Rivaroxaban 15 MG Tabs tablet Commonly known as: XARELTO Take 15 mg by mouth daily with supper.   sotalol 80 MG tablet Commonly known as: BETAPACE Take 80 mg by mouth 2 (two) times daily.            Discharge Care Instructions  (From admission, onward)         Start     Ordered   04/27/20 0000  Change dressing on IV access line weekly and PRN  (Home infusion instructions - [facility] )        04/27/20 1137          Diagnostic Studies: DG MINI C-ARM IMAGE ONLY  Result Date: 04/26/2020 There is no interpretation for this exam.  This order is for images obtained during a surgical procedure.  Please See "Surgeries" Tab for more information regarding the procedure.   XR Ankle Complete Left  Result Date: 04/22/2020 AP lateral mortise left ankle reviewed.  Hardware fixation for bimalleolar ankle fracture in good position alignment with no interval change.  No new fracture.  Fracture consolidation has occurred on the fibular side.  On the  medial side there is improvement in fracture consolidation.  [place] EKG SITE RITE  Result Date: 04/26/2020 If Site Rite image not attached, placement could not be confirmed due to current cardiac rhythm.   Disposition: Discharge disposition: 01-Home or Self Care       Discharge Instructions    [facility] pharmacist to adjust dose for Vancomycin, Aminoglycosides and other anti-infective therapies as requested by physician.   Complete by: As directed    [facility] to provide Cath Flo 2mg    Complete by: As directed    Administer for PICC line occlusion and as ordered by physician for other access device issues.   Anaphylaxis Kit: Provided to treat any anaphylactic reaction to the medication being provided to the patient if First Dose or when requested by physician   Complete by: As directed    Epinephrine 1mg /ml vial / amp: Administer 0.3mg  (0.36ml) subcutaneously once for moderate to severe anaphylaxis, nurse to call physician and pharmacy when reaction occurs and call 911 if needed for immediate care   Diphenhydramine 50mg /ml IV vial: Administer 25-50mg  IV/IM PRN for first dose reaction, rash, itching, mild reaction, nurse to call physician and pharmacy when reaction occurs   Sodium Chloride 0.9% NS 541ml IV: Administer if needed for hypovolemic blood pressure drop or as ordered by physician after call to physician with anaphylactic reaction   Call  MD / Call 911   Complete by: As directed    If you experience chest pain or shortness of breath, CALL 911 and be transported to the hospital emergency room.  If you develope a fever above 101 F, pus (white drainage) or increased drainage or redness at the wound, or calf pain, call your surgeon's office.   Change dressing on IV access line weekly and PRN   Complete by: As directed    Constipation Prevention   Complete by: As directed    Drink plenty of fluids.  Prune juice may be helpful.  You may use a stool softener, such  as Colace (over the counter) 100 mg twice a day.  Use MiraLax (over the counter) for constipation as needed.   Diet - low sodium heart healthy   Complete by: As directed    Discharge instructions   Complete by: As directed    Do not walk or weightbear on the operative leg.  Remain in the post-operative splint.  Elevate the leg to help with swelling and pain and allow skin to heal better. We will remove the splint at your first post-operative appointment.  Follow-up with Dr. Marlou Sa in clinic at your given appointment date in ~1-2 weeks.  Call the office with any questions or concerns.   Flush IV access with Sodium Chloride 0.9% and Heparin 10 units/ml or 100 units/ml   Complete by: As directed    Home infusion instructions - [facility]   Complete by: As directed    Instructions: Flush IV access with Sodium Chloride 0.9% and Heparin 10units/ml or 100units/ml   Change dressing on IV access line: Weekly and PRN   Instructions Cath Flo 36m: Administer for PICC Line occlusion and as ordered by physician for other access device   [facility] pharmacist to adjust dose for: Vancomycin, Aminoglycosides and other anti-infective therapies as requested by physician   Increase activity slowly as tolerated   Complete by: As directed    Method of administration may be changed at the discretion of home infusion pharmacist based upon assessment of the patient and/or caregiver's ability to self-administer the medication ordered   Complete by: As directed        Follow-up Information    [facility]   Specialty: Home Health Services Why: Home Health RN/PT services arranged. They will contact you about 48 hours after discharge to arrange visit.  Contact information: [phone]        DMeredith Pel MD Follow up.   Specialty: Orthopedic Surgery Contact information: [address] [phone]               Signed: CDonella Stade2/01/2021, 1:02 AM

## 2020-05-17 ENCOUNTER — Encounter: Payer: Self-pay | Admitting: Infectious Disease

## 2020-05-17 ENCOUNTER — Ambulatory Visit (INDEPENDENT_AMBULATORY_CARE_PROVIDER_SITE_OTHER): Payer: Medicare Other | Admitting: Orthopedic Surgery

## 2020-05-17 ENCOUNTER — Telehealth: Payer: Self-pay

## 2020-05-17 DIAGNOSIS — S82892G Other fracture of left lower leg, subsequent encounter for closed fracture with delayed healing: Secondary | ICD-10-CM

## 2020-05-17 NOTE — Telephone Encounter (Signed)
Received call from [facility] nurse with [facility]. She states patient's daptomycin was discontinued last week and patient was started on ceftriaxone. Angelique Blonder wondering if CPK lab still needs to be drawn. RN spoke with Marcos Eke, NP. RN relayed verbal orders to [facility] per Marcos Eke, NP to continue with the CPK this week to make sure it is trending down, but that it can be stopped next week. Orders repeated and verified.    Sandie Ano, RN

## 2020-05-19 ENCOUNTER — Telehealth: Payer: Self-pay

## 2020-05-19 NOTE — Telephone Encounter (Signed)
Patient currently being treated with Cubicin for osteomyelitis. Patient states she is now experiencing itching to her vaginal area like she has a "yeast infection".   Routing message to provider for advise. Patient has no allergies, prefers to [employer] listed in epic.  [name]

## 2020-05-20 ENCOUNTER — Other Ambulatory Visit: Payer: Self-pay | Admitting: Infectious Diseases

## 2020-05-20 DIAGNOSIS — N760 Acute vaginitis: Secondary | ICD-10-CM

## 2020-05-20 MED ORDER — TERCONAZOLE 0.4 % VA CREA: 1.0000 | TOPICAL_CREAM | Freq: Every day | VAGINAL | 0 refills | Status: AC

## 2020-05-20 NOTE — Telephone Encounter (Signed)
Will send in 7 days of flucon Thanks!

## 2020-05-20 NOTE — Telephone Encounter (Signed)
Make that terazole as flucon will interact with her heart meds Rx sent thanks

## 2020-05-22 ENCOUNTER — Encounter: Payer: Self-pay | Admitting: Orthopedic Surgery

## 2020-05-22 NOTE — Progress Notes (Signed)
   Post-Op Visit Note   Patient: [name]           Date of Birth: [birth year]           MRN: [id number] Visit Date: 05/17/2020 PCP: System, Provider Not In   Assessment & Plan:  Chief Complaint:  Chief Complaint  Patient presents with  . Other     Recheck incision   Visit Diagnoses:  1. Fracture of malleolus, closed, left, with delayed healing, subsequent encounter     Plan: Patient presents now about 3 weeks out left ankle I&D.  Sutures removed today.  Overall the incision looks intact.  She is not having much pain.  Really give her a low fracture boot and have her continue with compression and come back in 2 weeks for clinical recheck on the incision.  No drainage redness or erythema around the incision at this time.  I think it still has to heal from the inside out but it looks like it is making progress and as long as we can keep the swelling down I think it has a chance to heal on its own.  Okay to start some weightbearing as well in 1 week.  Follow-Up Instructions: No follow-ups on file.   Orders:  No orders of the defined types were placed in this encounter.  No orders of the defined types were placed in this encounter.   Imaging: No results found.  PMFS History: Patient Active Problem List   Diagnosis Date Noted  . 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 Congestive heart failure (HCC)   . Coronary artery disease involving native heart without angina pectoris   . Hardware complicating wound infection [facility])    Past Medical History:  Diagnosis Date  . CHF (congestive heart failure) (HCC)   . Coronary artery disease   . Dysrhythmia    a-fib  . HLD (hyperlipidemia)   . Hypertension     No family history on file.  Past Surgical History:  Procedure Laterality Date  . ATRIAL FIBRILLATION ABLATION    . CHOLECYSTECTOMY    . COLONOSCOPY    . EYE SURGERY Bilateral    cataractions removed  . HARDWARE REMOVAL Left 04/26/2020   Procedure: LEFT ANKLE  IRRIGATION AND DEBRIDEMENT, HARDWARE REMOVAL;  Surgeon: Cammy Copa, MD;  Location: [facility] OR;  Service: Orthopedics;  Laterality: Left;  . ORIF ANKLE FRACTURE Left 03/16/2020   Procedure: OPEN REDUCTION INTERNAL FIXATION (ORIF) LEFT ANKLE FRACTURE;  Surgeon: Cammy Copa, MD;  Location: [facility] OR;  Service: Orthopedics;  Laterality: Left;   Social History   Occupational History  . Not on file  Tobacco Use  . Smoking status: Never Smoker  . Smokeless tobacco: Never Used  Vaping Use  . Vaping Use: Never used  Substance and Sexual Activity  . Alcohol use: Yes    Comment: occasional wine  . Drug use: Never  . Sexual activity: Not on file

## 2020-05-25 NOTE — Telephone Encounter (Signed)
Thanks Wallie Char and [place]!

## 2020-05-26 ENCOUNTER — Encounter: Payer: Self-pay | Admitting: Infectious Disease

## 2020-05-26 ENCOUNTER — Ambulatory Visit
Admission: RE | Admit: 2020-05-26 | Discharge: 2020-05-26 | Disposition: A | Discharge status: Home / Self Care | Payer: Medicare Other | Source: Ambulatory Visit | Attending: Infectious Disease | Admitting: Infectious Disease

## 2020-05-26 ENCOUNTER — Telehealth: Payer: Self-pay | Admitting: *Deleted

## 2020-05-26 ENCOUNTER — Ambulatory Visit (INDEPENDENT_AMBULATORY_CARE_PROVIDER_SITE_OTHER): Payer: Medicare Other | Admitting: Infectious Disease

## 2020-05-26 ENCOUNTER — Ambulatory Visit: Payer: Medicare Other | Admitting: Infectious Disease

## 2020-05-26 ENCOUNTER — Other Ambulatory Visit: Payer: Self-pay

## 2020-05-26 VITALS — BP 155/90 | HR 83 | Temp 96.0°F | Ht 64.0 in | Wt 185.0 lb

## 2020-05-26 DIAGNOSIS — T847XXD Infection and inflammatory reaction due to other internal orthopedic prosthetic devices, implants and grafts, subsequent encounter: Secondary | ICD-10-CM

## 2020-05-26 DIAGNOSIS — M869 Osteomyelitis, unspecified: Secondary | ICD-10-CM

## 2020-05-26 DIAGNOSIS — A4901 Methicillin susceptible Staphylococcus aureus infection, unspecified site: Secondary | ICD-10-CM | POA: Diagnosis not present

## 2020-05-26 DIAGNOSIS — I2510 Atherosclerotic heart disease of native coronary artery without angina pectoris: Secondary | ICD-10-CM | POA: Diagnosis not present

## 2020-05-26 HISTORY — DX: Methicillin susceptible Staphylococcus aureus infection, unspecified site: A49.01

## 2020-05-26 MED ORDER — CEPHALEXIN 500 MG PO CAPS: 500.0000 mg | ORAL_CAPSULE | Freq: Four times a day (QID) | ORAL | 2 refills | Status: AC

## 2020-05-26 NOTE — Progress Notes (Signed)
Subjective:  Chief complaint new blisters on her medial scar anxiety about her infection   Patient ID: [name], female    DOB: 10-19-1940, 80 y.o.   MRN: [id number]  HPI 80 year old lady with CAD, CHF with fracture of ankle after fall sp ORIF now with infection of hardware, sp removal.  Cultures yielded methicillin sensitive Staph aureus.  Given presence of hardware and bone we were treating as osteomyelitis.  Her inflammatory markers were also elevated with a sed rate of 40 and C-reactive protein 1.2.  In the hospital we placed her on daptomycin once daily.  She ultimately grew methicillin sensitive Staph aureus and we propose 3 times daily cefazolin but she really wanted to be on a once a day antibiotic so we went with the daptomycin.  Unfortunately she developed elevation in her CPK which did not resolve with stopping her statin.  We ultimately switched her from daptomycin to ceftriaxone which she is currently taking.  She has been following closely with Dr. Marlou Sa and has an appoint with him on Monday, 28 February.  She has noticed some blistering on the medial aspect of her ankle and was wondering if this could be due to the antibiotics and if she should be concerned.  She feels that the ceftriaxone may be is causing a bit of acid reflux and is concerned about what antibiotics can be doing to" my insides."  She does not have much in the way of pain in her ankle partly because she is not bearing weight.  She does not recall having much pain at presentation but more had drainage and erythema around her surgical wound at the time she was diagnosed with her hardware associated infection.  [facility] show that her sed rate has actually climbed from the 40s to 58 CRP is gone up slightly as well from 2-4.  I have labs from today CPK has resolved.  CBC and renal function and liver function all normal.  The new findings on the medial aspect of the wound and concerns about  potential ongoing infection I ordered plain films of her ankle today.  Past Medical History:  Diagnosis Date  . CHF (congestive heart failure) ([facility])   . Coronary artery disease   . Dysrhythmia    a-fib  . HLD (hyperlipidemia)   . Hypertension   . MSSA (methicillin susceptible Staphylococcus aureus) infection 05/26/2020    Past Surgical History:  Procedure Laterality Date  . ATRIAL FIBRILLATION ABLATION    . CHOLECYSTECTOMY    . COLONOSCOPY    . EYE SURGERY Bilateral    cataractions removed  . HARDWARE REMOVAL Left 04/26/2020   Procedure: LEFT ANKLE IRRIGATION AND DEBRIDEMENT, HARDWARE REMOVAL;  Surgeon: Meredith Pel, MD;  Location: [facility];  Service: Orthopedics;  Laterality: Left;  . ORIF ANKLE FRACTURE Left 03/16/2020   Procedure: OPEN REDUCTION INTERNAL FIXATION (ORIF) LEFT ANKLE FRACTURE;  Surgeon: Meredith Pel, MD;  Location: [facility];  Service: Orthopedics;  Laterality: Left;    No family history on file.    Social History   Socioeconomic History  . Marital status: Married    Spouse name: Not on file  . Number of children: Not on file  . Years of education: Not on file  . Highest education level: Not on file  Occupational History  . Not on file  Tobacco Use  . Smoking status: Never Smoker  . Smokeless tobacco: Never Used  Vaping Use  . Vaping  Use: Never used  Substance and Sexual Activity  . Alcohol use: Yes    Comment: occasional wine  . Drug use: Never  . Sexual activity: Not on file  Other Topics Concern  . Not on file  Social History Narrative  . Not on file   Social Determinants of Health   Financial Resource Strain: Not on file  Food Insecurity: Not on file  Transportation Needs: Not on file  Physical Activity: Not on file  Stress: Not on file  Social Connections: Not on file    No Known Allergies   Current Outpatient Medications:  .  cephALEXin (KEFLEX) 500 MG capsule, Take 1 capsule (500 mg total) by mouth 4 (four) times daily.,  Disp: 120 capsule, Rfl: 2 .  atorvastatin (LIPITOR) 10 MG tablet, Take 10 mg by mouth at bedtime. (Patient not taking: Reported on 05/05/2020), Disp: , Rfl:  .  calcium carbonate (TUMS - DOSED IN MG ELEMENTAL CALCIUM) 500 MG chewable tablet, Chew 1-2 tablets by mouth 3 (three) times daily as needed for indigestion or heartburn., Disp: , Rfl:  .  daptomycin (CUBICIN) IVPB, Inject 700 mg into the vein daily. Indication:  Osteomyelitis  First Dose: No Last Day of Therapy: 06/07/2020 Labs - Once weekly:  CBC/D, BMP, and CPK Labs - Every other week:  ESR and CRP Method of administration: IV Push Method of administration may be changed at the discretion of home infusion pharmacist based upon assessment of the patient and/or caregiver's ability to self-administer the medication ordered., Disp: 42 Units, Rfl: 0 .  HYDROcodone-acetaminophen (NORCO/VICODIN) 5-325 MG tablet, Take 1 tablet by mouth every 4 (four) hours as needed for moderate pain., Disp: 30 tablet, Rfl: 0 .  methocarbamol (ROBAXIN) 500 MG tablet, Take 1 tablet (500 mg total) by mouth every 8 (eight) hours as needed., Disp: 30 tablet, Rfl: 0 .  Rivaroxaban (XARELTO) 15 MG TABS tablet, Take 15 mg by mouth daily with supper., Disp: , Rfl:  .  sotalol (BETAPACE) 80 MG tablet, Take 80 mg by mouth 2 (two) times daily., Disp: , Rfl:  .  terconazole (TERAZOL 7) 0.4 % vaginal cream, Place 1 applicator vaginally at bedtime., Disp: 45 g, Rfl: 0   Review of Systems  Constitutional: Negative for activity change, appetite change, chills, diaphoresis, fatigue, fever and unexpected weight change.  HENT: Negative for congestion, rhinorrhea, sinus pressure, sneezing, sore throat and trouble swallowing.   Eyes: Negative for photophobia and visual disturbance.  Respiratory: Negative for cough, chest tightness, shortness of breath, wheezing and stridor.   Cardiovascular: Negative for chest pain, palpitations and leg swelling.  Gastrointestinal: Negative for abdominal  distention, abdominal pain, anal bleeding, blood in stool, constipation, diarrhea, nausea and vomiting.  Genitourinary: Negative for difficulty urinating, dysuria, flank pain and hematuria.  Musculoskeletal: Negative for arthralgias, back pain, gait problem, joint swelling and myalgias.  Skin: Positive for rash and wound. Negative for color change and pallor.  Neurological: Negative for dizziness, tremors, weakness and light-headedness.  Hematological: Negative for adenopathy. Does not bruise/bleed easily.  Psychiatric/Behavioral: Negative for agitation, behavioral problems, confusion, decreased concentration, dysphoric mood and sleep disturbance. The patient is nervous/anxious.        Objective:   Physical Exam Constitutional:      General: She is not in acute distress.    Appearance: Normal appearance. She is well-developed and well-nourished. She is not ill-appearing or diaphoretic.  HENT:     Head: Normocephalic and atraumatic.     Right Ear: Hearing and external  ear normal.     Left Ear: Hearing and external ear normal.     Nose: No nasal deformity, rhinorrhea or epistaxis.  Eyes:     General: No scleral icterus.    Extraocular Movements: Extraocular movements intact and EOM normal.     Conjunctiva/sclera: Conjunctivae normal.     Right eye: Right conjunctiva is not injected.     Left eye: Left conjunctiva is not injected.  Neck:     Vascular: No JVD.  Cardiovascular:     Rate and Rhythm: Normal rate and regular rhythm.     Heart sounds: S1 normal and S2 normal.  Pulmonary:     Effort: Pulmonary effort is normal.  Abdominal:     General: Bowel sounds are normal. There is no distension or ascites.     Palpations: Abdomen is soft. There is no hepatosplenomegaly.     Tenderness: There is no abdominal tenderness.  Musculoskeletal:        General: Normal range of motion.     Right shoulder: Normal.     Left shoulder: Normal.     Cervical back: Normal range of motion and neck  supple.     Right hip: Normal.     Left hip: Normal.     Right knee: Normal.     Left knee: Normal.  Lymphadenopathy:     Head:     Right side of head: No submandibular, preauricular or posterior auricular adenopathy.     Left side of head: No submandibular, preauricular or posterior auricular adenopathy.     Cervical: No cervical adenopathy.     Right cervical: No superficial or deep cervical adenopathy.    Left cervical: No superficial or deep cervical adenopathy.  Skin:    General: Skin is warm, dry and intact.     Coloration: Skin is not pale.     Findings: No abrasion, bruising, ecchymosis, erythema, lesion or rash.     Nails: There is no clubbing or cyanosis.  Neurological:     General: No focal deficit present.     Mental Status: She is alert and oriented to person, place, and time.     Sensory: No sensory deficit.     Coordination: Coordination normal.     Gait: Gait normal.     Deep Tendon Reflexes: Strength normal.  Psychiatric:        Attention and Perception: Attention normal. She is attentive.        Mood and Affect: Mood is anxious.        Speech: Speech normal.        Behavior: Behavior normal. Behavior is cooperative.        Thought Content: Thought content normal.        Cognition and Memory: Cognition, memory and cognition and memory normal.        Judgment: Judgment normal.     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  Fortunately her pain has not increased though she does not seem to have had much pain with this infection in the first place.  She is very eager to have this particular clinical condition resolved and to return to [place] preferably by early April.  I am going to  obtain some plain films today and also for my note to Dr. Marlou Sa.  She asked whether she should see Dr. Marlou Sa before Monday but I told her I would correspond with him about that and get more data in the form of her plain films we have also added repeat sed rate and CRP to her current labs.  My plan if she were to do well would be to have her complete her ceftriaxone and then switch over to oral cephalexin.  I have ordered a prescription for cephalexin and sent this to her pharmacy with instructions to start this after her last day of ceftriaxone.  If her plain films are disconcerting and Dr. Marlou Sa is also concerned about deep infection and need for repeat surgical intervention will want to prolong IV antibiotics for her.  I spent greater than 50 minutes with the patient including greater than 50% of time in face to face counsel of the patient regarding the nature of hardware associated infections bone infections how we monitor them and treat them and in view of her laboratory data radiographic data coordination of her care

## 2020-05-26 NOTE — Telephone Encounter (Signed)
Per Dr Tommy Medal, verbal order relayed to Tim at [facility]. OK to pull PICC at end of treatment, please add ESR/CRP this week. Landis Gandy, RN

## 2020-05-31 ENCOUNTER — Ambulatory Visit (INDEPENDENT_AMBULATORY_CARE_PROVIDER_SITE_OTHER): Payer: Medicare Other | Admitting: Orthopedic Surgery

## 2020-05-31 DIAGNOSIS — S82892G Other fracture of left lower leg, subsequent encounter for closed fracture with delayed healing: Secondary | ICD-10-CM

## 2020-06-02 ENCOUNTER — Encounter: Payer: Self-pay | Admitting: Orthopedic Surgery

## 2020-06-02 NOTE — Progress Notes (Signed)
   Post-Op Visit Note   Patient: [name]           Date of Birth: 11/20/1940           MRN: [id number] Visit Date: 05/31/2020 PCP: System, Provider Not In   Assessment & Plan:  Chief Complaint:  Chief Complaint  Patient presents with  . Left Ankle - Routine Post Op   Visit Diagnoses:  1. Fracture of malleolus, closed, left, with delayed healing, subsequent encounter     Plan: Hannie is a 80 year old patient for recheck incision on left ankle.  Short boot is better.  March 7 PICC line comes out.  Takes Keflex after that.  Radiographs from infectious disease physician are reviewed.  They mention some periosteal reaction but I think that this reaction from the hardware removal and not any type of infection.  Overall the incision is looking good with about a 1 mm gap over a 5 mm area with no drainage or erythema.  Ankle also feels stable with good range of motion.  Plan to start physical therapy after March 7.  Gait training plus range of motion.  Handicap sticker for 4 months provided.  3-week return for clinical recheck.  Overall clinically the ankle does not have pain which is important.  Follow-Up Instructions: No follow-ups on file.   Orders:  Orders Placed This Encounter  Procedures  . Ambulatory referral to Physical Therapy   No orders of the defined types were placed in this encounter.   Imaging: No results found.  PMFS History: Patient Active Problem List   Diagnosis Date Noted  . MSSA (methicillin susceptible Staphylococcus aureus) infection 05/26/2020  . 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 Congestive heart failure ([facility])   . Coronary artery disease involving native heart without angina pectoris   . Hardware complicating wound infection [facility])    Past Medical History:  Diagnosis Date  . CHF (congestive heart failure) ([facility])   . Coronary artery disease   . Dysrhythmia    a-fib  . HLD (hyperlipidemia)   . Hypertension   . MSSA (methicillin  susceptible Staphylococcus aureus) infection 05/26/2020    No family history on file.  Past Surgical History:  Procedure Laterality Date  . ATRIAL FIBRILLATION ABLATION    . CHOLECYSTECTOMY    . COLONOSCOPY    . EYE SURGERY Bilateral    cataractions removed  . HARDWARE REMOVAL Left 04/26/2020   Procedure: LEFT ANKLE IRRIGATION AND DEBRIDEMENT, HARDWARE REMOVAL;  Surgeon: Cammy Copa, MD;  Location: [facility] OR;  Service: Orthopedics;  Laterality: Left;  . ORIF ANKLE FRACTURE Left 03/16/2020   Procedure: OPEN REDUCTION INTERNAL FIXATION (ORIF) LEFT ANKLE FRACTURE;  Surgeon: Cammy Copa, MD;  Location: [facility] OR;  Service: Orthopedics;  Laterality: Left;   Social History   Occupational History  . Not on file  Tobacco Use  . Smoking status: Never Smoker  . Smokeless tobacco: Never Used  Vaping Use  . Vaping Use: Never used  Substance and Sexual Activity  . Alcohol use: Yes    Comment: occasional wine  . Drug use: Never  . Sexual activity: Not on file

## 2020-06-09 ENCOUNTER — Ambulatory Visit (INDEPENDENT_AMBULATORY_CARE_PROVIDER_SITE_OTHER): Payer: Medicare Other | Admitting: Rehabilitative and Restorative Service Providers"

## 2020-06-09 ENCOUNTER — Other Ambulatory Visit: Payer: Self-pay

## 2020-06-09 ENCOUNTER — Encounter: Payer: Self-pay | Admitting: Rehabilitative and Restorative Service Providers"

## 2020-06-09 DIAGNOSIS — R262 Difficulty in walking, not elsewhere classified: Secondary | ICD-10-CM

## 2020-06-09 DIAGNOSIS — M6281 Muscle weakness (generalized): Secondary | ICD-10-CM | POA: Diagnosis not present

## 2020-06-09 DIAGNOSIS — M25572 Pain in left ankle and joints of left foot: Secondary | ICD-10-CM

## 2020-06-09 DIAGNOSIS — R600 Localized edema: Secondary | ICD-10-CM

## 2020-06-09 NOTE — Patient Instructions (Signed)
Access Code: [email or web address] URL: [email or web address] Date: 06/09/2020 Prepared by: Chyrel Masson  Exercises Ankle Pumps in Elevation - 3 x daily - 7 x weekly - 3 sets - 10 reps Ankle Alphabet in Elevation - 3 x daily - 7 x weekly - 1 sets - 26 reps Seated Calf Stretch with Strap - 3 x daily - 7 x weekly - 1 sets - 5 reps - 30 hold Seated Heel Raise - 3 x daily - 7 x weekly - 3 sets - 10 reps

## 2020-06-09 NOTE — Therapy (Signed)
[facility] [address] [place], [place], [id number] Phone: [phone]   Fax:  [phone]  Physical Therapy Evaluation  Patient Details  Name: [name] MRN: [id number] Date of Birth: 09/18/1940 Referring Provider (PT): Dr. August Saucerean   Encounter Date: 06/09/2020   PT End of Session - 06/09/20 1607    Visit Number 1    Number of Visits 20    Date for PT Re-Evaluation 08/18/20    Authorization Type Medicare    Progress Note Due on Visit 10    PT Start Time 1516    PT Stop Time 1555    PT Time Calculation (min) 39 min    Activity Tolerance Patient tolerated treatment well    Behavior [facility] for tasks assessed/performed           Past Medical History:  Diagnosis Date  . CHF (congestive heart failure) (HCC)   . Coronary artery disease   . Dysrhythmia    a-fib  . HLD (hyperlipidemia)   . Hypertension   . MSSA (methicillin susceptible Staphylococcus aureus) infection 05/26/2020    Past Surgical History:  Procedure Laterality Date  . ATRIAL FIBRILLATION ABLATION    . CHOLECYSTECTOMY    . COLONOSCOPY    . EYE SURGERY Bilateral    cataractions removed  . HARDWARE REMOVAL Left 04/26/2020   Procedure: LEFT ANKLE IRRIGATION AND DEBRIDEMENT, HARDWARE REMOVAL;  Surgeon: Cammy Copaean, Gregory Scott, MD;  [facility];  Service: Orthopedics;  Laterality: Left;  . ORIF ANKLE FRACTURE Left 03/16/2020   Procedure: OPEN REDUCTION INTERNAL FIXATION (ORIF) LEFT ANKLE FRACTURE;  Surgeon: Cammy Copaean, Gregory Scott, MD;  [facility];  Service: Orthopedics;  Laterality: Left;    There were no vitals filed for this visit.    Subjective Assessment - 06/09/20 1511    Subjective Pt. comes to clinic c Lt ankle/leg pain s/p malleolus fracture 03/16/2020 with ORIF, then I&D c hardware removal 04/26/2020 with subsequent infection.  PICC line removed 06/07/2020 after 6 weeks.   Initial injury was Thanksgiving Day when she fell down stairs.  Pt. stated  infection gave most trouble during time since initial surgery.  Pt. stated it has taken some time to heal.  Pt. stated no pain to report for Lt ankle in last few weeks. Currently walking c boot on Lt LE and FWW, had been using knee surgery.  [place] staying with family.    Limitations Standing;Walking;House hold activities    Patient Stated Goals Wants to do things independently, walking independently without restriction.    Currently in Pain? No/denies    Pain Score 0-No pain   pain at worst 5/10   Pain Location Ankle    Pain Orientation Left    Pain Descriptors / Indicators Sore    Pain Type Surgical pain    Pain Onset More than a month ago    Pain Frequency Occasional    Aggravating Factors  walking, standing, general mobility (limitation more than just pain)    Pain Relieving Factors none    Effect of Pain on Daily Activities Walking, stairs, stairs are limited.              [facility] PT Assessment - 06/09/20 0001      Assessment   Medical Diagnosis Fracture Lt malleolus c surgery    Referring Provider (PT) Dr. August Saucerean    Onset Date/Surgical Date 03/16/20    Hand Dominance Right      Precautions  Precaution Comments Recent I&D c hardware removal, infection      Restrictions   Weight Bearing Restrictions No      Balance Screen   Has the patient fallen in the past 6 months Yes    How many times? 1    Has the patient had a decrease in activity level because of a fear of falling?  No    Is the patient reluctant to leave their home because of a fear of falling?  No      Home Environment   Living Environment Private residence    Additional Comments Currently living with family member, bedroom on first floor (shower on second floor).  [place]ual [place] steps.      Prior Function   Level of Independence Independent    Vocation Requirements Retired    Leisure Personnel officer   Overall Cognitive Status Within Functional Limits for tasks  assessed      Observation/Other Assessments   Focus on Therapeutic Outcomes (FOTO)  intake 21%, outcome expected 53%      Observation/Other Assessments-Edema    Edema Figure 8      Figure 8 Edema   Figure 8 - Right  18.5 inch    Figure 8 - Left  19.25 inch      Functional Tests   Functional tests Single leg stance;Sit to Stand      Single Leg Stance   Comments Did not assess today      Sit to Stand   Comments UE use from 18 inch chair      ROM / Strength   AROM / PROM / Strength PROM;Strength;AROM      AROM   Overall AROM Comments AROM measured in seated knee flexion to 90 deg    AROM Assessment Site Ankle;Knee    Right/Left Knee Left;Right    Right/Left Ankle Left;Right    Right Ankle Dorsiflexion 10    Right Ankle Plantar Flexion 42    Right Ankle Inversion 35    Right Ankle Eversion 5    Left Ankle Dorsiflexion 5    Left Ankle Plantar Flexion 40    Left Ankle Inversion 14    Left Ankle Eversion 10      PROM   Overall PROM Comments PROM overpressure equal to AROM at this time, no pain noted    PROM Assessment Site Ankle    Right/Left Ankle Left;Right      Strength   Strength Assessment Site Ankle;Knee;Hip    Right/Left Hip Left;Right    Right/Left Knee Left;Right    Right Knee Flexion 5/5    Right Knee Extension 5/5    Left Knee Flexion 5/5    Left Knee Extension 5/5    Right/Left Ankle Left;Right    Right Ankle Dorsiflexion 5/5    Right Ankle Plantar Flexion 3+/5    Right Ankle Inversion 5/5    Right Ankle Eversion 5/5    Left Ankle Dorsiflexion 4+/5    Left Ankle Plantar Flexion 2+/5    Left Ankle Inversion 4/5    Left Ankle Eversion 4/5      Palpation   Palpation comment No specific tenderness reported to touch      Ambulation/Gait   Gait Comments FWW c walker boot step through gait pattern 150 ft supervision.                      Objective measurements completed on examination:  See above findings.       [facility] Adult PT  Treatment/Exercise - 06/09/20 0001      Exercises   Exercises Other Exercises;Ankle    Other Exercises  HEP instruction/performance c cues for techniques, handout provided.  trial set of each exercise for comprehesion and symptom assessment.      Manual Therapy   Manual therapy comments g2-g3 ap talocrural jt mobs, medial/lateral subtalar mobs g2-g3 Lt ankle                  PT Education - 06/09/20 1607    Education Details HEP, POC    Person(s) Educated Patient    Methods Explanation;Demonstration;Handout;Verbal cues    Comprehension Returned demonstration;Verbalized understanding            PT Short Term Goals - 06/09/20 1512      PT SHORT TERM GOAL #1   Title Patient will demonstrate independent use of home exercise program to maintain progress from in clinic treatments.    Time 3    Period Weeks    Status New    Target Date 06/30/20             PT Long Term Goals - 06/09/20 1512      PT LONG TERM GOAL #1   Title Patient will demonstrate/report pain at worst less than or equal to 2/10 to facilitate minimal limitation in daily activity secondary to pain symptoms.    Time 10    Period Weeks    Status New    Target Date 08/18/20      PT LONG TERM GOAL #2   Title Patient will demonstrate independent use of home exercise program to facilitate ability to maintain/progress functional gains from skilled physical therapy services.    Time 10    Period Weeks    Status New    Target Date 08/18/20      PT LONG TERM GOAL #3   Title Pt. will demonstrate Lt ankle AROM equal to Rt to facilitate usual mobility, walking, transfers at PLOF s limitation.    Time 10    Period Weeks    Status New    Target Date 08/18/20      PT LONG TERM GOAL #4   Title Pt. will demonstrate Lt LE MMT 5/5 throughout to faciltiate usual standing, walking, stairs at PLOF s limitation.    Time 10    Period Weeks    Status New    Target Date 08/18/20      PT LONG TERM GOAL #5   Title  Pt. will demonstrate independent ambulation s deviation for daily mobility at PLOF.    Time 10    Period Weeks    Status New    Target Date 08/18/20      Additional Long Term Goals   Additional Long Term Goals Yes      PT LONG TERM GOAL #6   Title Pt. will demonstrate FOTO outcome >   = 53 to indicate reduced disability.    Time 10    Period Weeks    Status New    Target Date 08/18/20      PT LONG TERM GOAL #7   Title Pt. will demonstrate ability to ascend/descend stairs c reciprocal gait pattern.    Time 10    Period Weeks    Status New    Target Date 08/18/20  Plan - 06/09/20 1514    Clinical Impression Statement Patient is a 80 y.o. female who comes to clinic with complaints of Lt ankle pain with mobility, strength and movement coordination deficits s/p fracture, ORIF, hardware removal that impair their ability to perform usual daily and recreational functional activities without increase difficulty/symptoms at this time.  Patient to benefit from skilled PT services to address impairments and limitations to improve to previous level of function without restriction secondary to condition.    Personal Factors and Comorbidities Comorbidity 3+    Comorbidities CHF, HTN, MSSA 05/26/2020    Examination-Activity Limitations Squat;Stairs;Stand;Transfers;Locomotion Level    Examination-Participation Restrictions Community Activity;Meal Prep    Stability/Clinical Decision Making Stable/Uncomplicated    Clinical Decision Making Low    Rehab Potential Good    PT Frequency 2x / week    PT Duration Other (comment)   10 weeks   PT Treatment/Interventions ADLs/Self Care Home Management;Cryotherapy;Electrical Stimulation;Iontophoresis 4mg /ml Dexamethasone;Moist Heat;Balance training;Therapeutic exercise;Therapeutic activities;Wheelchair mobility training;Functional mobility training;Stair training;Gait training;DME Instruction;Ultrasound;Neuromuscular  re-education;Patient/family education;Passive range of motion;Dry needling;Joint Manipulations;Taping;Vasopneumatic Device;Manual techniques    PT Next Visit Plan Reassess use of HEP, ankle 4 way strengthening, transitioning from boot in standing WB    PT Home Exercise Plan [email or web address]    Consulted and Agree with Plan of Care Patient           Patient will benefit from skilled therapeutic intervention in order to improve the following deficits and impairments:  Abnormal gait,Decreased endurance,Hypomobility,Pain,Decreased strength,Decreased activity tolerance,Decreased balance,Difficulty walking,Decreased mobility,Improper body mechanics,Impaired perceived functional ability,Impaired flexibility,Decreased coordination,Decreased range of motion,Increased edema  Visit Diagnosis: Pain in left ankle and joints of left foot  Muscle weakness (generalized)  Localized edema  Difficulty in walking, not elsewhere classified     Problem List Patient Active Problem List   Diagnosis Date Noted  . MSSA (methicillin susceptible Staphylococcus aureus) infection 05/26/2020  . 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 Congestive heart failure (HCC)   . Coronary artery disease involving native heart without angina pectoris   . Hardware complicating wound infection (HCC)     04/28/2020, PT, DPT, OCS, ATC 06/09/20  4:13 PM    [facility] Physical Therapy [address] [place], [place], [place] Phone: [phone]   Fax:  [phone]  Name: [name] MRN: Lauren June Date of Birth: [birth year]

## 2020-06-11 ENCOUNTER — Encounter: Payer: Self-pay | Admitting: Physical Therapy

## 2020-06-11 ENCOUNTER — Other Ambulatory Visit: Payer: Self-pay

## 2020-06-11 ENCOUNTER — Ambulatory Visit (INDEPENDENT_AMBULATORY_CARE_PROVIDER_SITE_OTHER): Payer: Medicare Other | Admitting: Physical Therapy

## 2020-06-11 DIAGNOSIS — M25572 Pain in left ankle and joints of left foot: Secondary | ICD-10-CM

## 2020-06-11 DIAGNOSIS — M6281 Muscle weakness (generalized): Secondary | ICD-10-CM

## 2020-06-11 DIAGNOSIS — R600 Localized edema: Secondary | ICD-10-CM | POA: Diagnosis not present

## 2020-06-11 DIAGNOSIS — R262 Difficulty in walking, not elsewhere classified: Secondary | ICD-10-CM | POA: Diagnosis not present

## 2020-06-11 NOTE — Therapy (Signed)
[facility] Physical Therapy [address] [place], [place], [address] Phone: [phone]   Fax:  [phone]  Physical Therapy Treatment  Patient Details  Name: [name] MRN: [id number] Date of Birth: 11-07-1940 Referring Provider (PT): Dr. August Saucer   Encounter Date: 06/11/2020   PT End of Session - 06/11/20 1154    Visit Number 2    Number of Visits 20    Date for PT Re-Evaluation 08/18/20    Authorization Type Medicare    Progress Note Due on Visit 10    PT Start Time 1055    PT Stop Time 1140    PT Time Calculation (min) 45 min    Activity Tolerance Patient tolerated treatment well    Behavior During Therapy [facility] for tasks assessed/performed           Past Medical History:  Diagnosis Date  . CHF (congestive heart failure) (HCC)   . Coronary artery disease   . Dysrhythmia    a-fib  . HLD (hyperlipidemia)   . Hypertension   . MSSA (methicillin susceptible Staphylococcus aureus) infection 05/26/2020    Past Surgical History:  Procedure Laterality Date  . ATRIAL FIBRILLATION ABLATION    . CHOLECYSTECTOMY    . COLONOSCOPY    . EYE SURGERY Bilateral    cataractions removed  . HARDWARE REMOVAL Left 04/26/2020   Procedure: LEFT ANKLE IRRIGATION AND DEBRIDEMENT, HARDWARE REMOVAL;  Surgeon: Cammy Copa, MD;  Location: [facility] OR;  Service: Orthopedics;  Laterality: Left;  . ORIF ANKLE FRACTURE Left 03/16/2020   Procedure: OPEN REDUCTION INTERNAL FIXATION (ORIF) LEFT ANKLE FRACTURE;  Surgeon: Cammy Copa, MD;  Location: [facility] OR;  Service: Orthopedics;  Laterality: Left;    There were no vitals filed for this visit.   Subjective Assessment - 06/11/20 1052    Subjective Pt. relays about 6/10 soreness and pain in her Lt ankle. She relays she is walking more with the RW at home and she has been compliant with HEP.    Limitations Standing;Walking;House hold activities    Patient Stated Goals Wants to do things independently, walking  independently without restriction.    Pain Onset More than a month ago            [facility] Adult PT Treatment/Exercise - 06/11/20 0001      Ambulation/Gait   Gait Comments FWW c walker boot step through gait pattern 150 ft supervision. Then able tto ambulate 10 ft without boot and FWW without complaints      Exercises   Exercises Ankle      Manual Therapy   Manual therapy comments Lt ankle PROM all planes,  g2-g3 ap, medial and lateral jt mobs      Ankle Exercises: Stretches   Soleus Stretch 3 reps;30 seconds   seated with strap   Gastroc Stretch 3 reps;30 seconds   seated with strap     Ankle Exercises: Aerobic   Nustep L4 X 8 min UE/LE and CAM boot removed      Ankle Exercises: Standing   Other Standing Ankle Exercises weight shifting without boot with bilat UE support X 2 min lateral, 1 min A-P with Lt foot in front, 1 min A-P with left foot behind      Ankle Exercises: Seated   Heel Raises Limitations 20    Toe Raise Limitations 20    BAPS Limitations L2  X20 reps A-P, lateral, circlres      Ankle Exercises: Supine   T-Band red X 15 4  way, cues to avoid hip compensation                    PT Short Term Goals - 06/09/20 1512      PT SHORT TERM GOAL #1   Title Patient will demonstrate independent use of home exercise program to maintain progress from in clinic treatments.    Time 3    Period Weeks    Status New    Target Date 06/30/20             PT Long Term Goals - 06/09/20 1512      PT LONG TERM GOAL #1   Title Patient will demonstrate/report pain at worst less than or equal to 2/10 to facilitate minimal limitation in daily activity secondary to pain symptoms.    Time 10    Period Weeks    Status New    Target Date 08/18/20      PT LONG TERM GOAL #2   Title Patient will demonstrate independent use of home exercise program to facilitate ability to maintain/progress functional gains from skilled physical therapy services.    Time 10    Period  Weeks    Status New    Target Date 08/18/20      PT LONG TERM GOAL #3   Title Pt. will demonstrate Lt ankle AROM equal to Rt to facilitate usual mobility, walking, transfers at PLOF s limitation.    Time 10    Period Weeks    Status New    Target Date 08/18/20      PT LONG TERM GOAL #4   Title Pt. will demonstrate Lt LE MMT 5/5 throughout to faciltiate usual standing, walking, stairs at PLOF s limitation.    Time 10    Period Weeks    Status New    Target Date 08/18/20      PT LONG TERM GOAL #5   Title Pt. will demonstrate independent ambulation s deviation for daily mobility at PLOF.    Time 10    Period Weeks    Status New    Target Date 08/18/20      Additional Long Term Goals   Additional Long Term Goals Yes      PT LONG TERM GOAL #6   Title Pt. will demonstrate FOTO outcome >   = 53 to indicate reduced disability.    Time 10    Period Weeks    Status New    Target Date 08/18/20      PT LONG TERM GOAL #7   Title Pt. will demonstrate ability to ascend/descend stairs c reciprocal gait pattern.    Time 10    Period Weeks    Status New    Target Date 08/18/20                 Plan - 06/11/20 1155    Clinical Impression Statement Began weight bearing activiites without CAM boot today and she had good tolerance to this and able to walk 10 feet within room with RW and no boot but she did not have a shoe for her left foot so did not attempt any furhter. Encourged her to bring both shoes next visit and to begin weightshifting exercises at home with UE support and out of the CAM boot. She overall tolerated session very well, continue POC.    Personal Factors and Comorbidities Comorbidity 3+    Comorbidities CHF, HTN, MSSA 05/26/2020    Examination-Activity Limitations Squat;Stairs;Stand;Transfers;Locomotion  Level    Examination-Participation Restrictions Community Activity;Meal Prep    Stability/Clinical Decision Making Stable/Uncomplicated    Rehab Potential Good     PT Frequency 2x / week    PT Duration Other (comment)   10 weeks   PT Treatment/Interventions ADLs/Self Care Home Management;Cryotherapy;Electrical Stimulation;Iontophoresis 4mg /ml Dexamethasone;Moist Heat;Balance training;Therapeutic exercise;Therapeutic activities;Wheelchair mobility training;Functional mobility training;Stair training;Gait training;DME Instruction;Ultrasound;Neuromuscular re-education;Patient/family education;Passive range of motion;Dry needling;Joint Manipulations;Taping;Vasopneumatic Device;Manual techniques    PT Next Visit Plan Reassess use of HEP, ankle 4 way strengthening, transitioning from boot in standing WB    PT Home Exercise Plan [email or web address]    Consulted and Agree with Plan of Care Patient           Patient will benefit from skilled therapeutic intervention in order to improve the following deficits and impairments:  Abnormal gait,Decreased endurance,Hypomobility,Pain,Decreased strength,Decreased activity tolerance,Decreased balance,Difficulty walking,Decreased mobility,Improper body mechanics,Impaired perceived functional ability,Impaired flexibility,Decreased coordination,Decreased range of motion,Increased edema  Visit Diagnosis: Pain in left ankle and joints of left foot  Muscle weakness (generalized)  Localized edema  Difficulty in walking, not elsewhere classified     Problem List Patient Active Problem List   Diagnosis Date Noted  . MSSA (methicillin susceptible Staphylococcus aureus) infection 05/26/2020  . 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 Congestive heart failure (HCC)   . Coronary artery disease involving native heart without angina pectoris   . Hardware complicating wound infection [facility])     [facility] 06/11/2020, 11:58 AM  [facility] Physical Therapy [address] [place], [place], [place] Phone: [phone]   Fax:  [phone]  Name: Daylene Vandenbosch MRN: Cherlynn June Date of Birth:  [birth year]

## 2020-06-16 ENCOUNTER — Other Ambulatory Visit: Payer: Self-pay

## 2020-06-16 ENCOUNTER — Ambulatory Visit (INDEPENDENT_AMBULATORY_CARE_PROVIDER_SITE_OTHER): Payer: Medicare Other | Admitting: Rehabilitative and Restorative Service Providers"

## 2020-06-16 ENCOUNTER — Encounter: Payer: Self-pay | Admitting: Rehabilitative and Restorative Service Providers"

## 2020-06-16 DIAGNOSIS — M25672 Stiffness of left ankle, not elsewhere classified: Secondary | ICD-10-CM

## 2020-06-16 DIAGNOSIS — R262 Difficulty in walking, not elsewhere classified: Secondary | ICD-10-CM

## 2020-06-16 DIAGNOSIS — R600 Localized edema: Secondary | ICD-10-CM

## 2020-06-16 DIAGNOSIS — M6281 Muscle weakness (generalized): Secondary | ICD-10-CM | POA: Diagnosis not present

## 2020-06-16 DIAGNOSIS — M25572 Pain in left ankle and joints of left foot: Secondary | ICD-10-CM | POA: Diagnosis not present

## 2020-06-16 NOTE — Patient Instructions (Signed)
Access Code: [email or web address] URL: [email or web address] Date: 06/16/2020 Prepared by: Pauletta Browns  Exercises Ankle Pumps in Elevation - 3 x daily - 7 x weekly - 3 sets - 10 reps Ankle Alphabet in Elevation - 3 x daily - 7 x weekly - 1 sets - 26 reps Seated Calf Stretch with Strap - 3 x daily - 7 x weekly - 1 sets - 5 reps - 30 hold Seated Heel Raise - 3 x daily - 7 x weekly - 3 sets - 10 reps Supine Quadricep Sets - 3 x daily - 7 x weekly - 3-4 sets - 10 reps - 5 seconds hold

## 2020-06-16 NOTE — Therapy (Signed)
[facility] Physical Therapy [address] [place], [place], [address] Phone: [phone]   Fax:  [phone]  Physical Therapy Treatment  Patient Details  Name: [name] MRN: [id number] Date of Birth: Sep 09, 1940 Referring Provider (PT): Dr. August Saucer   Encounter Date: 06/16/2020   PT End of Session - 06/16/20 1450    Visit Number 3    Number of Visits 20    Date for PT Re-Evaluation 08/18/20    Authorization Type Medicare    Progress Note Due on Visit 10    PT Start Time 1301    PT Stop Time 1347    PT Time Calculation (min) 46 min    Equipment Utilized During Treatment Other (comment)   L walking boot and R "level-up"   Activity Tolerance Patient tolerated treatment well;No increased pain;Patient limited by fatigue    Behavior During Therapy [facility] for tasks assessed/performed           Past Medical History:  Diagnosis Date  . CHF (congestive heart failure) (HCC)   . Coronary artery disease   . Dysrhythmia    a-fib  . HLD (hyperlipidemia)   . Hypertension   . MSSA (methicillin susceptible Staphylococcus aureus) infection 05/26/2020    Past Surgical History:  Procedure Laterality Date  . ATRIAL FIBRILLATION ABLATION    . CHOLECYSTECTOMY    . COLONOSCOPY    . EYE SURGERY Bilateral    cataractions removed  . HARDWARE REMOVAL Left 04/26/2020   Procedure: LEFT ANKLE IRRIGATION AND DEBRIDEMENT, HARDWARE REMOVAL;  Surgeon: Cammy Copa, MD;  Location: [facility] OR;  Service: Orthopedics;  Laterality: Left;  . ORIF ANKLE FRACTURE Left 03/16/2020   Procedure: OPEN REDUCTION INTERNAL FIXATION (ORIF) LEFT ANKLE FRACTURE;  Surgeon: Cammy Copa, MD;  Location: [facility] OR;  Service: Orthopedics;  Laterality: Left;    There were no vitals filed for this visit.   Subjective Assessment - 06/16/20 1446    Subjective Jermika reports consistent 3X/day HEP compliance.    Limitations Standing;Walking;House hold activities    Patient Stated Goals Wants to do  things independently, walking independently without restriction.    Currently in Pain? No/denies    Pain Onset More than a month ago                             [facility] Adult PT Treatment/Exercise - 06/16/20 0001      Ambulation/Gait   Gait Comments Sophie was able to walk between activities in a shoe with her wheeled walker      Exercises   Exercises Ankle      Ankle Exercises: Stretches   Soleus Stretch 3 reps;30 seconds   seated with strap   Gastroc Stretch 3 reps;30 seconds   seated with strap   Slant Board Stretch 3 reps;30 seconds   Standing and seated R foot on floor and L foot on board slightly toed in     Ankle Exercises: Aerobic   Nustep L4 X 5 minutes in shoe      Ankle Exercises: Standing   Heel Raises Both;10 reps;Other (comment)   Use walker to help decrease WB   Toe Raise 10 reps;3 seconds;Other (comment)   Use walker to help decrease WB     Ankle Exercises: Seated   Heel Raises Limitations 20    Toe Raise Limitations 20    Other Seated Ankle Exercises Theraband Inversion/Eversion Red 20X with slow eccentrics      Ankle  Exercises: Supine   Other Supine Ankle Exercises Quadriceps sets with B ankle dorsiflexion 20X 5 seconds                  PT Education - 06/16/20 1447    Education Details Encouraged seated heel cords box when she is able to aquire one.  Encouraged 100/day quadriceps sets with B ankle dorsiflexion to improve ankle dorsiflexion AROM and quadriceps strength to reduce knee pain with WB (possibly due to deconditioning and now increased WB function).    Person(s) Educated Patient    Methods Explanation;Demonstration;Tactile cues;Verbal cues;Handout    Comprehension Returned demonstration;Need further instruction;Verbal cues required;Verbalized understanding;Tactile cues required            PT Short Term Goals - 06/16/20 1449      PT SHORT TERM GOAL #1   Title Patient will demonstrate independent use of home exercise  program to maintain progress from in clinic treatments.    Time 3    Period Weeks    Status Achieved    Target Date 06/30/20             PT Long Term Goals - 06/16/20 1449      PT LONG TERM GOAL #1   Title Patient will demonstrate/report pain at worst less than or equal to 2/10 to facilitate minimal limitation in daily activity secondary to pain symptoms.    Time 10    Period Weeks    Status On-going      PT LONG TERM GOAL #2   Title Patient will demonstrate independent use of home exercise program to facilitate ability to maintain/progress functional gains from skilled physical therapy services.    Time 10    Period Weeks    Status On-going      PT LONG TERM GOAL #3   Title Pt. will demonstrate Lt ankle AROM equal to Rt to facilitate usual mobility, walking, transfers at PLOF s limitation.    Time 10    Period Weeks    Status On-going      PT LONG TERM GOAL #4   Title Pt. will demonstrate Lt LE MMT 5/5 throughout to faciltiate usual standing, walking, stairs at PLOF s limitation.    Time 10    Period Weeks    Status On-going      PT LONG TERM GOAL #5   Title Pt. will demonstrate independent ambulation s deviation for daily mobility at PLOF.    Time 10    Period Weeks    Status On-going      PT LONG TERM GOAL #6   Title Pt. will demonstrate FOTO outcome >   = 53 to indicate reduced disability.    Time 10    Period Weeks    Status On-going      PT LONG TERM GOAL #7   Title Pt. will demonstrate ability to ascend/descend stairs c reciprocal gait pattern.    Time 10    Period Weeks    Status On-going                 Plan - 06/16/20 1451    Clinical Impression Statement [name] has been very good with her HEP compliance.  Started seated and standing heel cords box, gentle inversion/eversion theraband and standing heel to toe raises with walker to reduce WB.  Continue resistance progression and strength work to improve WB function, improve DF AROM and  return [place] to her previous level of function.  Personal Factors and Comorbidities Comorbidity 3+    Comorbidities CHF, HTN, MSSA 05/26/2020    Examination-Activity Limitations Squat;Stairs;Stand;Transfers;Locomotion Level    Examination-Participation Restrictions Community Activity;Meal Prep    Stability/Clinical Decision Making Stable/Uncomplicated    Rehab Potential Good    PT Frequency 2x / week    PT Duration Other (comment)   10 weeks   PT Treatment/Interventions ADLs/Self Care Home Management;Cryotherapy;Electrical Stimulation;Iontophoresis 4mg /ml Dexamethasone;Moist Heat;Balance training;Therapeutic exercise;Therapeutic activities;Wheelchair mobility training;Functional mobility training;Stair training;Gait training;DME Instruction;Ultrasound;Neuromuscular re-education;Patient/family education;Passive range of motion;Dry needling;Joint Manipulations;Taping;Vasopneumatic Device;Manual techniques    PT Next Visit Plan Progress resistive exercises, start proprioception and balance to complement DF AROM and ankle strength work    PT Home Exercise Plan [id number]    Consulted and Agree with Plan of Care Patient           Patient will benefit from skilled therapeutic intervention in order to improve the following deficits and impairments:  Abnormal gait,Decreased endurance,Hypomobility,Pain,Decreased strength,Decreased activity tolerance,Decreased balance,Difficulty walking,Decreased mobility,Improper body mechanics,Impaired perceived functional ability,Impaired flexibility,Decreased coordination,Decreased range of motion,Increased edema  Visit Diagnosis: Difficulty in walking, not elsewhere classified  Muscle weakness (generalized)  Pain in left ankle and joints of left foot  Stiffness of left ankle, not elsewhere classified  Localized edema     Problem List Patient Active Problem List   Diagnosis Date Noted  . MSSA (methicillin susceptible Staphylococcus aureus) infection  05/26/2020  . 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 Congestive heart failure (HCC)   . Coronary artery disease involving native heart without angina pectoris   . Hardware complicating wound infection (HCC)     04/28/2020 PT, MPT 06/16/2020, 3:13 PM  [facility] Physical Therapy [address] [place], [place], [place] Phone: [phone]   Fax:  [phone]  Name: Kathrina Crosley MRN: Cherlynn June Date of Birth: February 09, 1941

## 2020-06-17 ENCOUNTER — Ambulatory Visit (INDEPENDENT_AMBULATORY_CARE_PROVIDER_SITE_OTHER): Payer: Medicare Other | Admitting: Physical Therapy

## 2020-06-17 DIAGNOSIS — M6281 Muscle weakness (generalized): Secondary | ICD-10-CM | POA: Diagnosis not present

## 2020-06-17 DIAGNOSIS — M25572 Pain in left ankle and joints of left foot: Secondary | ICD-10-CM

## 2020-06-17 DIAGNOSIS — M25672 Stiffness of left ankle, not elsewhere classified: Secondary | ICD-10-CM

## 2020-06-17 DIAGNOSIS — R600 Localized edema: Secondary | ICD-10-CM

## 2020-06-17 DIAGNOSIS — R262 Difficulty in walking, not elsewhere classified: Secondary | ICD-10-CM

## 2020-06-17 NOTE — Therapy (Signed)
[facility] Physical Therapy [address] [place], [place], [address] Phone: [phone]   Fax:  [phone]  Physical Therapy Treatment  Patient Details  Name: [name] MRN: [id number] Date of Birth: [birth year] Referring Provider (PT): Dr. August Saucer   Encounter Date: 06/17/2020   PT End of Session - 06/17/20 1515    Visit Number 4    Number of Visits 20    Date for PT Re-Evaluation 08/18/20    Authorization Type Medicare    Progress Note Due on Visit 10    PT Start Time 1427    PT Stop Time 1520    PT Time Calculation (min) 53 min    Equipment Utilized During Treatment Other (comment)   L walking boot and R "level-up"   Activity Tolerance Patient tolerated treatment well;No increased pain;Patient limited by fatigue    Behavior During Therapy [facility] for tasks assessed/performed           Past Medical History:  Diagnosis Date  . CHF (congestive heart failure) (HCC)   . Coronary artery disease   . Dysrhythmia    a-fib  . HLD (hyperlipidemia)   . Hypertension   . MSSA (methicillin susceptible Staphylococcus aureus) infection 05/26/2020    Past Surgical History:  Procedure Laterality Date  . ATRIAL FIBRILLATION ABLATION    . CHOLECYSTECTOMY    . COLONOSCOPY    . EYE SURGERY Bilateral    cataractions removed  . HARDWARE REMOVAL Left 04/26/2020   Procedure: LEFT ANKLE IRRIGATION AND DEBRIDEMENT, HARDWARE REMOVAL;  Surgeon: Cammy Copa, MD;  Location: [facility] OR;  Service: Orthopedics;  Laterality: Left;  . ORIF ANKLE FRACTURE Left 03/16/2020   Procedure: OPEN REDUCTION INTERNAL FIXATION (ORIF) LEFT ANKLE FRACTURE;  Surgeon: Cammy Copa, MD;  Location: [facility] OR;  Service: Orthopedics;  Laterality: Left;    There were no vitals filed for this visit.   Subjective Assessment - 06/17/20 1435    Subjective Rionna reports no more than mild pain overall in her ankle upon arrival.    Limitations Standing;Walking;House hold activities    Patient  Stated Goals Wants to do things independently, walking independently without restriction.    Pain Onset More than a month ago            [facility] Adult PT Treatment/Exercise - 06/17/20 0001      Ambulation/Gait   Gait Comments ambulated with regular shoe 150 ft with RW around obstacles and stopping to walk backwards at time so simulate gait inside house and she was mod I with this, then progressed to [facility] and she was min guard for 75 ft step through pattern.      Modalities   Modalities Vasopneumatic      Vasopneumatic   Number Minutes Vasopneumatic  10 minutes    Vasopnuematic Location  Ankle    Vasopneumatic Pressure High    Vasopneumatic Temperature  34      Manual Therapy   Manual therapy comments Lt ankle PROM all planes,  g2-g3 ap, medial and lateral jt mobs      Ankle Exercises: Aerobic   Nustep L5 X 6 minutes in shoe      Ankle Exercises: Stretches   Slant Board Stretch 3 reps;30 seconds    Other Stretch DF lunge stretch with LLE on slantboard 10 sec X 10      Ankle Exercises: Standing   Heel Raises 15 reps    Toe Raise 15 reps      Ankle Exercises: Seated  BAPS Level 3;15 reps   4 way and circles     Ankle Exercises: Supine   T-Band green X 15 4 way                    PT Short Term Goals - 06/16/20 1449      PT SHORT TERM GOAL #1   Title Patient will demonstrate independent use of home exercise program to maintain progress from in clinic treatments.    Time 3    Period Weeks    Status Achieved    Target Date 06/30/20             PT Long Term Goals - 06/16/20 1449      PT LONG TERM GOAL #1   Title Patient will demonstrate/report pain at worst less than or equal to 2/10 to facilitate minimal limitation in daily activity secondary to pain symptoms.    Time 10    Period Weeks    Status On-going      PT LONG TERM GOAL #2   Title Patient will demonstrate independent use of home exercise program to facilitate ability to maintain/progress  functional gains from skilled physical therapy services.    Time 10    Period Weeks    Status On-going      PT LONG TERM GOAL #3   Title Pt. will demonstrate Lt ankle AROM equal to Rt to facilitate usual mobility, walking, transfers at PLOF s limitation.    Time 10    Period Weeks    Status On-going      PT LONG TERM GOAL #4   Title Pt. will demonstrate Lt LE MMT 5/5 throughout to faciltiate usual standing, walking, stairs at PLOF s limitation.    Time 10    Period Weeks    Status On-going      PT LONG TERM GOAL #5   Title Pt. will demonstrate independent ambulation s deviation for daily mobility at PLOF.    Time 10    Period Weeks    Status On-going      PT LONG TERM GOAL #6   Title Pt. will demonstrate FOTO outcome >   = 53 to indicate reduced disability.    Time 10    Period Weeks    Status On-going      PT LONG TERM GOAL #7   Title Pt. will demonstrate ability to ascend/descend stairs c reciprocal gait pattern.    Time 10    Period Weeks    Status On-going                 Plan - 06/17/20 1516    Clinical Impression Statement Able to progress ambulation today without boot and regular shoe, she looks good with this and had good tolerance so recommending she discontinue CAM boot. We will continue to progress her from RW to [facility] as able.    Personal Factors and Comorbidities Comorbidity 3+    Comorbidities CHF, HTN, MSSA 05/26/2020    Examination-Activity Limitations Squat;Stairs;Stand;Transfers;Locomotion Level    Examination-Participation Restrictions Community Activity;Meal Prep    Stability/Clinical Decision Making Stable/Uncomplicated    Rehab Potential Good    PT Frequency 2x / week    PT Duration Other (comment)   10 weeks   PT Treatment/Interventions ADLs/Self Care Home Management;Cryotherapy;Electrical Stimulation;Iontophoresis 4mg /ml Dexamethasone;Moist Heat;Balance training;Therapeutic exercise;Therapeutic activities;Wheelchair mobility  training;Functional mobility training;Stair training;Gait training;DME Instruction;Ultrasound;Neuromuscular re-education;Patient/family education;Passive range of motion;Dry needling;Joint Manipulations;Taping;Vasopneumatic Device;Manual techniques    PT Next Visit  Plan Progress resistive exercises, start proprioception and balance to complement DF AROM and ankle strength work    PT Home Exercise Plan [id number]    Consulted and Agree with Plan of Care Patient           Patient will benefit from skilled therapeutic intervention in order to improve the following deficits and impairments:  Abnormal gait,Decreased endurance,Hypomobility,Pain,Decreased strength,Decreased activity tolerance,Decreased balance,Difficulty walking,Decreased mobility,Improper body mechanics,Impaired perceived functional ability,Impaired flexibility,Decreased coordination,Decreased range of motion,Increased edema  Visit Diagnosis: Difficulty in walking, not elsewhere classified  Muscle weakness (generalized)  Pain in left ankle and joints of left foot  Stiffness of left ankle, not elsewhere classified  Localized edema     Problem List Patient Active Problem List   Diagnosis Date Noted  . MSSA (methicillin susceptible Staphylococcus aureus) infection 05/26/2020  . 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 Congestive heart failure (HCC)   . Coronary artery disease involving native heart without angina pectoris   . Hardware complicating wound infection [facility])     April Manson, PT,DPT 06/17/2020, 3:17 PM  [facility] Physical Therapy [address] [place], [place], [address] Phone: [phone]   Fax:  [phone]  Name: [name] MRN: [id number] Date of Birth: 09-28-1940

## 2020-06-18 ENCOUNTER — Ambulatory Visit: Payer: Medicare Other | Admitting: Infectious Disease

## 2020-06-21 ENCOUNTER — Ambulatory Visit (INDEPENDENT_AMBULATORY_CARE_PROVIDER_SITE_OTHER): Payer: Medicare Other | Admitting: Orthopedic Surgery

## 2020-06-21 DIAGNOSIS — S82892G Other fracture of left lower leg, subsequent encounter for closed fracture with delayed healing: Secondary | ICD-10-CM

## 2020-06-22 ENCOUNTER — Other Ambulatory Visit: Payer: Self-pay

## 2020-06-22 ENCOUNTER — Ambulatory Visit (INDEPENDENT_AMBULATORY_CARE_PROVIDER_SITE_OTHER): Payer: Medicare Other | Admitting: Physical Therapy

## 2020-06-22 ENCOUNTER — Encounter: Payer: Self-pay | Admitting: Physical Therapy

## 2020-06-22 ENCOUNTER — Encounter: Payer: Self-pay | Admitting: Orthopedic Surgery

## 2020-06-22 DIAGNOSIS — R262 Difficulty in walking, not elsewhere classified: Secondary | ICD-10-CM | POA: Diagnosis not present

## 2020-06-22 DIAGNOSIS — M6281 Muscle weakness (generalized): Secondary | ICD-10-CM | POA: Diagnosis not present

## 2020-06-22 DIAGNOSIS — M25572 Pain in left ankle and joints of left foot: Secondary | ICD-10-CM

## 2020-06-22 DIAGNOSIS — R600 Localized edema: Secondary | ICD-10-CM

## 2020-06-22 DIAGNOSIS — M25672 Stiffness of left ankle, not elsewhere classified: Secondary | ICD-10-CM | POA: Diagnosis not present

## 2020-06-22 NOTE — Progress Notes (Signed)
Post-Op Visit Note   Patient: [name]           Date of Birth: July 02, 1940           MRN: [id number] Visit Date: 06/21/2020 PCP: System, Provider Not In   Assessment & Plan:  Chief Complaint:  Chief Complaint  Patient presents with  . Left Ankle - Follow-up   Visit Diagnoses: No diagnosis found.  Plan: Patient is a 80 year old female who presents s/p left ankle hardware removal and irrigation and debridement on 04/26/2020.  She states that she is doing very well.  She has finished her IV antibiotics and PICC line has been removed.  She is taking oral Keflex since 06/08/2020.  Reports that she has about two thirds of the way into her oral course of antibiotics.  She is going to physical therapy 2 times per week and making good progress.  Ambulating in a boot with a walker and occasionally a regular shoe.  She is using compression sock to help with swelling in her leg.  Denies any fevers, chills, drainage from the incisions.  She sees Dr. Daiva Eves next Monday.  On exam her incisions are healing well.  There is a small indentation in the lateral incision but there is no surrounding erythema, evidence of dehiscence, expressible drainage.  No calf tenderness.  Negative Homans' sign.  Dorsiflexes to about 15 degrees with the knee flexed.  Mild tenderness over the lateral malleolus that she rates about 1/10.  Overall she is doing very well.  Plan to transition to a cane physical therapy as she has 4 more physical therapy visits before [facility] [place] [facility] [place] on [facility].  Follow-up as needed.  Follow-Up Instructions: No follow-ups on file.   Orders:  No orders of the defined types were placed in this encounter.  No orders of the defined types were placed in this encounter.   Imaging: No results found.  PMFS History: Patient Active Problem List   Diagnosis Date Noted  . MSSA (methicillin susceptible Staphylococcus aureus) infection 05/26/2020  . Bone infection of left  ankle (HCC) 04/26/2020  . Congestive heart failure (HCC)   . Coronary artery disease involving native heart without angina pectoris   . Hardware complicating wound infection [facility])    Past Medical History:  Diagnosis Date  . CHF (congestive heart failure) (HCC)   . Coronary artery disease   . Dysrhythmia    a-fib  . HLD (hyperlipidemia)   . Hypertension   . MSSA (methicillin susceptible Staphylococcus aureus) infection 05/26/2020    No family history on file.  Past Surgical History:  Procedure Laterality Date  . ATRIAL FIBRILLATION ABLATION    . CHOLECYSTECTOMY    . COLONOSCOPY    . EYE SURGERY Bilateral    cataractions removed  . HARDWARE REMOVAL Left 04/26/2020   Procedure: LEFT ANKLE IRRIGATION AND DEBRIDEMENT, HARDWARE REMOVAL;  Surgeon: Cammy Copa, MD;  Location: [facility] OR;  Service: Orthopedics;  Laterality: Left;  . ORIF ANKLE FRACTURE Left 03/16/2020   Procedure: OPEN REDUCTION INTERNAL FIXATION (ORIF) LEFT ANKLE FRACTURE;  Surgeon: Cammy Copa, MD;  Location: [facility] OR;  Service: Orthopedics;  Laterality: Left;   Social History   Occupational History  . Not on file  Tobacco Use  . Smoking status: Never Smoker  . Smokeless tobacco: Never Used  Vaping Use  . Vaping Use: Never used  Substance and Sexual Activity  . Alcohol use: Yes    Comment: occasional wine  .  Drug use: Never  . Sexual activity: Not on file

## 2020-06-22 NOTE — Therapy (Signed)
[facility] [address] [place], [place], [address] Phone: [phone]   Fax:  [phone]  Physical Therapy Treatment  Patient Details  Name: [name] MRN: [id number] Date of Birth: 03/29/1941 Referring Provider (PT): Dr. August Saucer   Encounter Date: 06/22/2020   PT End of Session - 06/22/20 1059    Visit Number 5    Number of Visits 20    Date for PT Re-Evaluation 08/18/20    Authorization Type Medicare    PT Start Time 1059    PT Stop Time 1149    PT Time Calculation (min) 50 min    Activity Tolerance Patient tolerated treatment well    Behavior During Therapy [facility] for tasks assessed/performed           Past Medical History:  Diagnosis Date  . CHF (congestive heart failure) (HCC)   . Coronary artery disease   . Dysrhythmia    a-fib  . HLD (hyperlipidemia)   . Hypertension   . MSSA (methicillin susceptible Staphylococcus aureus) infection 05/26/2020    Past Surgical History:  Procedure Laterality Date  . ATRIAL FIBRILLATION ABLATION    . CHOLECYSTECTOMY    . COLONOSCOPY    . EYE SURGERY Bilateral    cataractions removed  . HARDWARE REMOVAL Left 04/26/2020   Procedure: LEFT ANKLE IRRIGATION AND DEBRIDEMENT, HARDWARE REMOVAL;  Surgeon: Cammy Copa, MD;  Location: [facility] OR;  Service: Orthopedics;  Laterality: Left;  . ORIF ANKLE FRACTURE Left 03/16/2020   Procedure: OPEN REDUCTION INTERNAL FIXATION (ORIF) LEFT ANKLE FRACTURE;  Surgeon: Cammy Copa, MD;  Location: [facility] OR;  Service: Orthopedics;  Laterality: Left;    There were no vitals filed for this visit.   Subjective Assessment - 06/22/20 1102    Subjective Pt reports her ankle is very sore today - not sure why, hasn't done anything differently. Saw Dr August Saucer yesterday and she was cleared by him. She is returning to [place] on 4/8    Patient Stated Goals Wants to do things independently, walking independently without restriction.    Currently in Pain? Yes    Pain  Score 5     Pain Location Ankle    Pain Orientation Left    Pain Descriptors / Indicators Tightness    Pain Type Surgical pain    Pain Onset More than a month ago    Pain Frequency Intermittent              [facility] PT Assessment - 06/22/20 0001      Assessment   Medical Diagnosis Fracture Lt malleolus c surgery                         [facility] Adult PT Treatment/Exercise - 06/22/20 0001      Ambulation/Gait   Gait Comments with cane around clinic, up/down ramp, close supervision, also worked on stance phase of gait at counter top.      Exercises   Exercises Ankle      Modalities   Modalities Vasopneumatic      Vasopneumatic   Number Minutes Vasopneumatic  10 minutes    Vasopnuematic Location  Ankle    Vasopneumatic Pressure High    Vasopneumatic Temperature  34      Ankle Exercises: Aerobic   Nustep L5 X 6 minutes in shoe      Ankle Exercises: Stretches   Gastroc Stretch 3 reps;20 seconds      Ankle Exercises: Standing   SLS Lt  3x30 sec with UE assist PRN,      Ankle Exercises: Seated   Other Seated Ankle Exercises red band eversion and PF x30 reps                    PT Short Term Goals - 06/16/20 1449      PT SHORT TERM GOAL #1   Title Patient will demonstrate independent use of home exercise program to maintain progress from in clinic treatments.    Time 3    Period Weeks    Status Achieved    Target Date 06/30/20             PT Long Term Goals - 06/16/20 1449      PT LONG TERM GOAL #1   Title Patient will demonstrate/report pain at worst less than or equal to 2/10 to facilitate minimal limitation in daily activity secondary to pain symptoms.    Time 10    Period Weeks    Status On-going      PT LONG TERM GOAL #2   Title Patient will demonstrate independent use of home exercise program to facilitate ability to maintain/progress functional gains from skilled physical therapy services.    Time 10    Period Weeks    Status  On-going      PT LONG TERM GOAL #3   Title Pt. will demonstrate Lt ankle AROM equal to Rt to facilitate usual mobility, walking, transfers at PLOF s limitation.    Time 10    Period Weeks    Status On-going      PT LONG TERM GOAL #4   Title Pt. will demonstrate Lt LE MMT 5/5 throughout to faciltiate usual standing, walking, stairs at PLOF s limitation.    Time 10    Period Weeks    Status On-going      PT LONG TERM GOAL #5   Title Pt. will demonstrate independent ambulation s deviation for daily mobility at PLOF.    Time 10    Period Weeks    Status On-going      PT LONG TERM GOAL #6   Title Pt. will demonstrate FOTO outcome >   = 53 to indicate reduced disability.    Time 10    Period Weeks    Status On-going      PT LONG TERM GOAL #7   Title Pt. will demonstrate ability to ascend/descend stairs c reciprocal gait pattern.    Time 10    Period Weeks    Status On-going                 Plan - 06/22/20 1105    Clinical Impression Statement Pt did well ambulating in the clinic with her cane, recommend she use this in the house now.  HEP was progressed to add in standing stretches and proprioception work as well as strengthening with band. Making progress to her goals.  She will be in town for 1.5 wks, recommend she continue with PT in [place] when she goes home.    Rehab Potential Good    PT Frequency 2x / week    PT Duration Other (comment)    PT Treatment/Interventions ADLs/Self Care Home Management;Cryotherapy;Electrical Stimulation;Iontophoresis 4mg /ml Dexamethasone;Moist Heat;Balance training;Therapeutic exercise;Therapeutic activities;Wheelchair mobility training;Functional mobility training;Stair training;Gait training;DME Instruction;Ultrasound;Neuromuscular re-education;Patient/family education;Passive range of motion;Dry needling;Joint Manipulations;Taping;Vasopneumatic Device;Manual techniques    PT Home Exercise Plan [email or web address]           Patient will benefit from  skilled therapeutic intervention in order to improve the following deficits and impairments:  Abnormal gait,Decreased endurance,Hypomobility,Pain,Decreased strength,Decreased activity tolerance,Decreased balance,Difficulty walking,Decreased mobility,Improper body mechanics,Impaired perceived functional ability,Impaired flexibility,Decreased coordination,Decreased range of motion,Increased edema  Visit Diagnosis: Difficulty in walking, not elsewhere classified  Muscle weakness (generalized)  Pain in left ankle and joints of left foot  Stiffness of left ankle, not elsewhere classified  Localized edema     Problem List Patient Active Problem List   Diagnosis Date Noted  . MSSA (methicillin susceptible Staphylococcus aureus) infection 05/26/2020  . 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 Congestive heart failure (HCC)   . Coronary artery disease involving native heart without angina pectoris   . Hardware complicating wound infection (HCC)     Roderic Scarce PT  06/22/2020, 1:01 PM  [facility] [address] [place], [place], [address] Phone: [phone]   Fax:  [phone]  Name: Ai Sonnenfeld MRN: [id number] Date of Birth: [birth year]

## 2020-06-22 NOTE — Patient Instructions (Signed)
Access Code: [email or web address] URL: [email or web address] Date: 06/22/2020 Prepared by: Roderic Scarce  Exercises Gastroc Stretch on Wall - 1 x daily - 3 reps - 30 sec hold Standing Single Leg Stance with Counter Support - 1 x daily - 5-10 reps - 30 sec hold Seated Ankle Eversion with Resistance - 1 x daily - 3 sets - 10 reps Seated Ankle Plantar Flexion with Resistance Loop - 1 x daily - 3 sets - 10 reps Standing Heel Raises - 1 x daily - 2 sets - 10 reps Heel Toe Raises with Counter Support - 1 x daily - 2 sets - 10 reps

## 2020-06-23 ENCOUNTER — Encounter: Payer: Self-pay | Admitting: Rehabilitative and Restorative Service Providers"

## 2020-06-23 ENCOUNTER — Ambulatory Visit (INDEPENDENT_AMBULATORY_CARE_PROVIDER_SITE_OTHER): Payer: Medicare Other | Admitting: Rehabilitative and Restorative Service Providers"

## 2020-06-23 DIAGNOSIS — M6281 Muscle weakness (generalized): Secondary | ICD-10-CM | POA: Diagnosis not present

## 2020-06-23 DIAGNOSIS — R262 Difficulty in walking, not elsewhere classified: Secondary | ICD-10-CM

## 2020-06-23 DIAGNOSIS — R600 Localized edema: Secondary | ICD-10-CM | POA: Diagnosis not present

## 2020-06-23 DIAGNOSIS — M25572 Pain in left ankle and joints of left foot: Secondary | ICD-10-CM

## 2020-06-23 NOTE — Patient Instructions (Addendum)
Access Code: [email or web address] URL: [email or web address] Date: 06/23/2020 Prepared by: Chyrel Masson  Exercises Gastroc Stretch on Wall - 2 x daily - 5 reps - 30 sec hold Standing Single Leg Stance with Counter Support - 2 x daily - 5 reps - 30 sec hold Seated Ankle Eversion with Resistance - 2 x daily - 3 sets - 10 reps Seated Ankle Plantar Flexion with Resistance Loop - 2 x daily - 3 sets - 10 reps Standing Heel Raises - 2 x daily - 2 sets - 10 reps Heel Toe Raises with Counter Support - 2 x daily - 2 sets - 10 reps Standing Dorsiflexion Self-Mobilization on Step - 2 x daily - 7 x weekly - 10 reps - 3 sets - 2-3 hold Retro Step - 2 x daily - 7 x weekly - 1 sets - 20 reps

## 2020-06-23 NOTE — Therapy (Addendum)
[facility] [address] [place], [place], [address] Phone: [phone]   Fax:  [phone]  Physical Therapy Treatment  Patient Details  Name: [name] MRN: [id number] Date of Birth: [birth year] Referring Provider (PT): Dr. August Saucer   Encounter Date: 06/23/2020   PT End of Session - 06/23/20 1253    Visit Number 6    Number of Visits 20    Date for PT Re-Evaluation 08/18/20    Authorization Type Medicare    Progress Note Due on Visit 10    PT Start Time 1256    PT Stop Time 1345    PT Time Calculation (min) 49 min    Activity Tolerance Patient tolerated treatment well    Behavior During Therapy [facility] for tasks assessed/performed           Past Medical History:  Diagnosis Date  . CHF (congestive heart failure) (HCC)   . Coronary artery disease   . Dysrhythmia    a-fib  . HLD (hyperlipidemia)   . Hypertension   . MSSA (methicillin susceptible Staphylococcus aureus) infection 05/26/2020    Past Surgical History:  Procedure Laterality Date  . ATRIAL FIBRILLATION ABLATION    . CHOLECYSTECTOMY    . COLONOSCOPY    . EYE SURGERY Bilateral    cataractions removed  . HARDWARE REMOVAL Left 04/26/2020   Procedure: LEFT ANKLE IRRIGATION AND DEBRIDEMENT, HARDWARE REMOVAL;  Surgeon: Cammy Copa, MD;  Location: [facility] OR;  Service: Orthopedics;  Laterality: Left;  . ORIF ANKLE FRACTURE Left 03/16/2020   Procedure: OPEN REDUCTION INTERNAL FIXATION (ORIF) LEFT ANKLE FRACTURE;  Surgeon: Cammy Copa, MD;  Location: [facility] OR;  Service: Orthopedics;  Laterality: Left;    There were no vitals filed for this visit.     07/01/20 1252  Symptoms/Limitations  Subjective No specific complaints reported today.  Discussed the plan for transitioning to [place] and possible newclinic there.  Patient Stated Goals Wants to do things independently, walking independently without restriction.  Pain Assessment  Currently in Pain? No/denies  Pain  Score 0  Pain Onset More than a month ago       [facility] PT Assessment - 06/23/20 0001      Assessment   Medical Diagnosis Fracture Lt malleolus c surgery    Referring Provider (PT) Dr. August Saucer    Onset Date/Surgical Date 03/16/20    Hand Dominance Right      AROM   Overall AROM Comments AROM measured in seated knee flexion to 90 deg    Left Ankle Dorsiflexion 6    Left Ankle Plantar Flexion 39    Left Ankle Inversion 15    Left Ankle Eversion 14      Strength   Left Ankle Dorsiflexion 5/5    Left Ankle Inversion 4/5    Left Ankle Eversion 5/5                         [facility] Adult PT Treatment/Exercise - 06/23/20 0001      Ambulation/Gait   Gait Comments SPC use for in clinic ambulation      Neuro Re-ed    Neuro Re-ed Details  SLS c moderate HHA on bars 30 sec x 4 Lt LE, retro step x 20 Lt LE posterior      Vasopneumatic   Number Minutes Vasopneumatic  10 minutes    Vasopnuematic Location  Ankle    Vasopneumatic Pressure High    Vasopneumatic Temperature  34  Manual Therapy   Manual therapy comments AP jt mobs g4 talocrural jt, subtalar medial/lateral jt mobs g3.  DF MWM c anterior sustained mob 2 x 10      Ankle Exercises: Aerobic   Nustep Lvl 5 6 mins      Ankle Exercises: Stretches   Gastroc Stretch 3 reps;30 seconds   runner stretch on incline board Lt   Other Stretch DF on step 2 x 10      Ankle Exercises: Standing   Other Standing Ankle Exercises 4 inch step up on Lt LE x 15 finger tip touch                  PT Education - 06/23/20 1324    Education Details HEP adjustment to contiue to address DF limitations.    Person(s) Educated Patient    Methods Explanation;Demonstration;Handout;Verbal cues    Comprehension Returned demonstration;Verbalized understanding            PT Short Term Goals - 06/16/20 1449      PT SHORT TERM GOAL #1   Title Patient will demonstrate independent use of home exercise program to maintain progress  from in clinic treatments.    Time 3    Period Weeks    Status Achieved    Target Date 06/30/20             PT Long Term Goals - 06/16/20 1449      PT LONG TERM GOAL #1   Title Patient will demonstrate/report pain at worst less than or equal to 2/10 to facilitate minimal limitation in daily activity secondary to pain symptoms.    Time 10    Period Weeks    Status On-going      PT LONG TERM GOAL #2   Title Patient will demonstrate independent use of home exercise program to facilitate ability to maintain/progress functional gains from skilled physical therapy services.    Time 10    Period Weeks    Status On-going      PT LONG TERM GOAL #3   Title Pt. will demonstrate Lt ankle AROM equal to Rt to facilitate usual mobility, walking, transfers at PLOF s limitation.    Time 10    Period Weeks    Status On-going      PT LONG TERM GOAL #4   Title Pt. will demonstrate Lt LE MMT 5/5 throughout to faciltiate usual standing, walking, stairs at PLOF s limitation.    Time 10    Period Weeks    Status On-going      PT LONG TERM GOAL #5   Title Pt. will demonstrate independent ambulation s deviation for daily mobility at PLOF.    Time 10    Period Weeks    Status On-going      PT LONG TERM GOAL #6   Title Pt. will demonstrate FOTO outcome >   = 53 to indicate reduced disability.    Time 10    Period Weeks    Status On-going      PT LONG TERM GOAL #7   Title Pt. will demonstrate ability to ascend/descend stairs c reciprocal gait pattern.    Time 10    Period Weeks    Status On-going                 Plan - 06/23/20 1324    Clinical Impression Statement Reassessment of active movement continued to show limitation in movement compared to Rt but most noted  in DF which negatively impacts ability to transition to toe off progression.    Personal Factors and Comorbidities Comorbidity 3+    Comorbidities CHF, HTN, MSSA 05/26/2020    Examination-Activity Limitations  Squat;Stairs;Stand;Transfers;Locomotion Level    Examination-Participation Restrictions Community Activity;Meal Prep    Stability/Clinical Decision Making Stable/Uncomplicated    Rehab Potential Good    PT Frequency 2x / week    PT Duration Other (comment)    PT Treatment/Interventions ADLs/Self Care Home Management;Cryotherapy;Electrical Stimulation;Iontophoresis 4mg /ml Dexamethasone;Moist Heat;Balance training;Therapeutic exercise;Therapeutic activities;Wheelchair mobility training;Functional mobility training;Stair training;Gait training;DME Instruction;Ultrasound;Neuromuscular re-education;Patient/family education;Passive range of motion;Dry needling;Joint Manipulations;Taping;Vasopneumatic Device;Manual techniques    PT Next Visit Plan DF mobilization, ap talocrural jt mobs c movement for DF mobility gains, progression through non compliant to compliant surface balance.    PT Home Exercise Plan [email or web address]    Consulted and Agree with Plan of Care Patient           Patient will benefit from skilled therapeutic intervention in order to improve the following deficits and impairments:  Abnormal gait,Decreased endurance,Hypomobility,Pain,Decreased strength,Decreased activity tolerance,Decreased balance,Difficulty walking,Decreased mobility,Improper body mechanics,Impaired perceived functional ability,Impaired flexibility,Decreased coordination,Decreased range of motion,Increased edema  Visit Diagnosis: Pain in left ankle and joints of left foot  Muscle weakness (generalized)  Localized edema  Difficulty in walking, not elsewhere classified     Problem List Patient Active Problem List   Diagnosis Date Noted  . MSSA (methicillin susceptible Staphylococcus aureus) infection 05/26/2020  . 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 Congestive heart failure (HCC)   . Coronary artery disease involving native heart without angina pectoris   . Hardware complicating wound infection (HCC)      04/28/2020, PT, DPT, OCS, ATC 07/01/20  12:54 PM      [facility] Therapy [address] [place], [place], [place] Phone: [phone]   Fax:  [phone]  Name: [name] MRN: [id number] Date of Birth: [birth year]

## 2020-06-28 ENCOUNTER — Other Ambulatory Visit: Payer: Self-pay

## 2020-06-28 ENCOUNTER — Ambulatory Visit (INDEPENDENT_AMBULATORY_CARE_PROVIDER_SITE_OTHER): Payer: Medicare Other | Admitting: Infectious Disease

## 2020-06-28 ENCOUNTER — Encounter: Payer: Self-pay | Admitting: Infectious Disease

## 2020-06-28 VITALS — BP 147/86 | HR 75 | Ht 64.0 in | Wt 184.0 lb

## 2020-06-28 DIAGNOSIS — T847XXD Infection and inflammatory reaction due to other internal orthopedic prosthetic devices, implants and grafts, subsequent encounter: Secondary | ICD-10-CM | POA: Diagnosis not present

## 2020-06-28 DIAGNOSIS — A4901 Methicillin susceptible Staphylococcus aureus infection, unspecified site: Secondary | ICD-10-CM | POA: Diagnosis not present

## 2020-06-28 DIAGNOSIS — M86162 Other acute osteomyelitis, left tibia and fibula: Secondary | ICD-10-CM

## 2020-06-28 DIAGNOSIS — M869 Osteomyelitis, unspecified: Secondary | ICD-10-CM

## 2020-06-28 DIAGNOSIS — I2510 Atherosclerotic heart disease of native coronary artery without angina pectoris: Secondary | ICD-10-CM

## 2020-06-28 NOTE — Progress Notes (Signed)
Subjective:  Chief complaint    Patient ID: [name], female    DOB: 1940-11-23, 80 y.o.   MRN: [id number]  HPI   80 year old lady with CAD, CHF with fracture of ankle after fall sp ORIF now with infection of hardware, sp removal.  Cultures yielded methicillin sensitive Staph aureus.  Given presence of hardware and bone we were treating as osteomyelitis.  Her inflammatory markers were also elevated with a sed rate of 40 and C-reactive protein 1.2.  In the hospital we placed her on daptomycin once daily.  She ultimately grew methicillin sensitive Staph aureus and we propose 3 times daily cefazolin but she really wanted to be on a once a day antibiotic so we went with the daptomycin.  Unfortunately she developed elevation in her CPK which did not resolve with stopping her statin.  We ultimately switched her from daptomycin to ceftriaxone which she was taking.  She has been following closely with Dr. August Saucer   Prior to her last visit she had noticed some blistering on the medial aspect of her ankle and was wondering if this could be due to the antibiotics and if she  should be concerned.  She felt then that the ceftriaxone might be causing a bit of acid reflux and is concerned about what antibiotics can be doing to" my insides."  She did not have much in the way of pain in her ankle partly because she is not bearing weight.  She did not recall having much pain at presentation but more had drainage and erythema around her surgical wound at the time she was diagnosed with her hardware associated infection.  [facility]abs from [facility] show that her sed rate has actually climbed from the 40s to 58 CRP is gone up slightly as well from 2-4.     The new findings on the medial aspect of the wound and concerns about potential ongoing infection I ordered plain films of her ankle I last saw her.  This showed a little bit of periosteal reaction on the distal fibular metadiaphysis.  Dr. August Saucer reviewed  the films and felt this was more likely due to postoperative findings and bone remodeling  Since then she has continued to improve.  She tells me that her inflammatory markers from the same week I saw her actually had already shown her sedimentation rate to have come down to 20.  She denies having pain there at rest occasionally with exertion she can have pain.  She has been doing quite a bit of physical therapy recently and she has been released from Dr. August Saucer.  She is scheduled to follow-up in [place] in a couple of weeks.    Past Medical History:  Diagnosis Date  . CHF (congestive heart failure) (HCC)   . Coronary artery disease   . Dysrhythmia    a-fib  . HLD (hyperlipidemia)   . Hypertension   . MSSA (methicillin susceptible Staphylococcus aureus) infection 05/26/2020    Past Surgical History:  Procedure Laterality Date  . ATRIAL FIBRILLATION ABLATION    . CHOLECYSTECTOMY    . COLONOSCOPY    . EYE SURGERY Bilateral    cataractions removed  . HARDWARE REMOVAL Left 04/26/2020   Procedure: LEFT ANKLE IRRIGATION AND DEBRIDEMENT, HARDWARE REMOVAL;  Surgeon: Cammy Copa, MD;  Location: [facility] OR;  Service: Orthopedics;  Laterality: Left;  . ORIF ANKLE FRACTURE Left 03/16/2020   Procedure: OPEN REDUCTION INTERNAL FIXATION (ORIF) LEFT ANKLE FRACTURE;  Surgeon: Rise Paganini  Lorin Picket, MD;  Location: [facility] OR;  Service: Orthopedics;  Laterality: Left;    No family history on file.    Social History   Socioeconomic History  . Marital status: Married    Spouse name: Not on file  . Number of children: Not on file  . Years of education: Not on file  . Highest education level: Not on file  Occupational History  . Not on file  Tobacco Use  . Smoking status: Never Smoker  . Smokeless tobacco: Never Used  Vaping Use  . Vaping Use: Never used  Substance and Sexual Activity  . Alcohol use: Yes    Comment: occasional wine  . Drug use: Never  . Sexual activity: Not on file  Other Topics  Concern  . Not on file  Social History Narrative  . Not on file   Social Determinants of Health   Financial Resource Strain: Not on file  Food Insecurity: Not on file  Transportation Needs: Not on file  Physical Activity: Not on file  Stress: Not on file  Social Connections: Not on file    No Known Allergies   Current Outpatient Medications:  .  atorvastatin (LIPITOR) 10 MG tablet, Take 10 mg by mouth at bedtime., Disp: , Rfl:  .  calcium carbonate (TUMS - DOSED IN MG ELEMENTAL CALCIUM) 500 MG chewable tablet, Chew 1-2 tablets by mouth 3 (three) times daily as needed for indigestion or heartburn., Disp: , Rfl:  .  HYDROcodone-acetaminophen (NORCO/VICODIN) 5-325 MG tablet, Take 1 tablet by mouth every 4 (four) hours as needed for moderate pain., Disp: 30 tablet, Rfl: 0 .  methocarbamol (ROBAXIN) 500 MG tablet, Take 1 tablet (500 mg total) by mouth every 8 (eight) hours as needed., Disp: 30 tablet, Rfl: 0 .  Rivaroxaban (XARELTO) 15 MG TABS tablet, Take 15 mg by mouth daily with supper., Disp: , Rfl:  .  sotalol (BETAPACE) 80 MG tablet, Take 80 mg by mouth 2 (two) times daily., Disp: , Rfl:  .  terconazole (TERAZOL 7) 0.4 % vaginal cream, Place 1 applicator vaginally at bedtime., Disp: 45 g, Rfl: 0   Review of Systems  Constitutional: Negative for activity change, appetite change, chills, diaphoresis, fatigue, fever and unexpected weight change.  HENT: Negative for congestion, rhinorrhea, sinus pressure, sneezing, sore throat and trouble swallowing.   Eyes: Negative for photophobia and visual disturbance.  Respiratory: Negative for cough, chest tightness, shortness of breath, wheezing and stridor.   Cardiovascular: Negative for chest pain, palpitations and leg swelling.  Gastrointestinal: Negative for abdominal distention, abdominal pain, anal bleeding, blood in stool, constipation, diarrhea, nausea and vomiting.  Genitourinary: Negative for difficulty urinating, dysuria, flank pain  and hematuria.  Musculoskeletal: Negative for arthralgias, back pain, gait problem, joint swelling and myalgias.  Skin: Positive for wound. Negative for color change and pallor.  Neurological: Negative for dizziness, tremors, weakness and light-headedness.  Hematological: Negative for adenopathy. Does not bruise/bleed easily.  Psychiatric/Behavioral: Negative for agitation, behavioral problems, confusion, decreased concentration, dysphoric mood and sleep disturbance.       Objective:   Physical Exam Constitutional:      General: She is not in acute distress.    Appearance: Normal appearance. She is well-developed. She is not ill-appearing or diaphoretic.  HENT:     Head: Normocephalic and atraumatic.     Right Ear: Hearing and external ear normal.     Left Ear: Hearing and external ear normal.     Nose: No nasal deformity or rhinorrhea.  Eyes:     General: No scleral icterus.    Extraocular Movements: Extraocular movements intact.     Conjunctiva/sclera: Conjunctivae normal.     Right eye: Right conjunctiva is not injected.     Left eye: Left conjunctiva is not injected.  Neck:     Vascular: No JVD.  Cardiovascular:     Rate and Rhythm: Normal rate and regular rhythm.     Heart sounds: S1 normal and S2 normal.  Pulmonary:     Effort: Pulmonary effort is normal.  Abdominal:     General: Bowel sounds are normal. There is no distension.     Palpations: Abdomen is soft.     Tenderness: There is no abdominal tenderness.  Musculoskeletal:        General: Normal range of motion.     Right shoulder: Normal.     Left shoulder: Normal.     Cervical back: Normal range of motion and neck supple.     Right hip: Normal.     Left hip: Normal.     Right knee: Normal.     Left knee: Normal.  Lymphadenopathy:     Head:     Right side of head: No submandibular, preauricular or posterior auricular adenopathy.     Left side of head: No submandibular, preauricular or posterior auricular  adenopathy.     Cervical: No cervical adenopathy.     Right cervical: No superficial or deep cervical adenopathy.    Left cervical: No superficial or deep cervical adenopathy.  Skin:    General: Skin is warm and dry.     Coloration: Skin is not pale.     Findings: No abrasion, bruising, ecchymosis, erythema, lesion or rash.     Nails: There is no clubbing.  Neurological:     General: No focal deficit present.     Mental Status: She is alert and oriented to person, place, and time.     Sensory: No sensory deficit.     Coordination: Coordination normal.     Gait: Gait normal.  Psychiatric:        Attention and Perception: Attention normal. She is attentive.        Mood and Affect: Mood is anxious.        Speech: Speech normal.        Behavior: Behavior normal. Behavior is cooperative.        Thought Content: Thought content normal.        Cognition and Memory: Cognition and memory normal.        Judgment: Judgment normal.       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  If they are reassuring she can stop her antibiotics.  She is going to be flying back up to [place] where she has a primary care practice that she can follow-up and certainly she needs referral to infectious disease and orthopedics am sure that they cannot accommodate that.

## 2020-06-29 ENCOUNTER — Encounter: Payer: Medicare Other | Admitting: Rehabilitative and Restorative Service Providers"

## 2020-06-29 LAB — CBC WITH DIFFERENTIAL/PLATELET
Absolute Monocytes: 501 cells/uL (ref 200–950)
Basophils Absolute: 46 cells/uL (ref 0–200)
Basophils Relative: 0.6 %
Eosinophils Absolute: 0 cells/uL — ABNORMAL LOW (ref 15–500)
Eosinophils Relative: 0 %
HCT: 44 % (ref 35.0–45.0)
Hemoglobin: 14.2 g/dL (ref 11.7–15.5)
Lymphs Abs: 1194 cells/uL (ref 850–3900)
MCH: 29.7 pg (ref 27.0–33.0)
MCHC: 32.3 g/dL (ref 32.0–36.0)
MCV: 92.1 fL (ref 80.0–100.0)
MPV: 9.4 fL (ref 7.5–12.5)
Monocytes Relative: 6.5 %
Neutro Abs: 5960 cells/uL (ref 1500–7800)
Neutrophils Relative %: 77.4 %
Platelets: 291 10*3/uL (ref 140–400)
RBC: 4.78 10*6/uL (ref 3.80–5.10)
RDW: 11.9 % (ref 11.0–15.0)
Total Lymphocyte: 15.5 %
WBC: 7.7 10*3/uL (ref 3.8–10.8)

## 2020-06-29 LAB — BASIC METABOLIC PANEL WITH GFR
BUN: 15 mg/dL (ref 7–25)
CO2: 26 mmol/L (ref 20–32)
Calcium: 9.4 mg/dL (ref 8.6–10.4)
Chloride: 107 mmol/L (ref 98–110)
Creat: 0.78 mg/dL (ref 0.60–0.93)
GFR, Est African American: 84 mL/min/{1.73_m2} (ref >=60)
GFR, Est Non African American: 72 mL/min/{1.73_m2} (ref >=60)
Glucose, Bld: 94 mg/dL (ref 65–99)
Potassium: 4.1 mmol/L (ref 3.5–5.3)
Sodium: 142 mmol/L (ref 135–146)

## 2020-06-29 LAB — C-REACTIVE PROTEIN: CRP: 2.8 mg/L (ref <8.0)

## 2020-06-29 LAB — SEDIMENTATION RATE: Sed Rate: 17 mm/h (ref 0–30)

## 2020-07-01 ENCOUNTER — Encounter: Payer: Self-pay | Admitting: Rehabilitative and Restorative Service Providers"

## 2020-07-01 ENCOUNTER — Ambulatory Visit (INDEPENDENT_AMBULATORY_CARE_PROVIDER_SITE_OTHER): Payer: Medicare Other | Admitting: Rehabilitative and Restorative Service Providers"

## 2020-07-01 ENCOUNTER — Other Ambulatory Visit: Payer: Self-pay

## 2020-07-01 DIAGNOSIS — R262 Difficulty in walking, not elsewhere classified: Secondary | ICD-10-CM

## 2020-07-01 DIAGNOSIS — M6281 Muscle weakness (generalized): Secondary | ICD-10-CM | POA: Diagnosis not present

## 2020-07-01 DIAGNOSIS — R600 Localized edema: Secondary | ICD-10-CM | POA: Diagnosis not present

## 2020-07-01 DIAGNOSIS — M25572 Pain in left ankle and joints of left foot: Secondary | ICD-10-CM | POA: Diagnosis not present

## 2020-07-01 NOTE — Therapy (Signed)
[facility] [address] [place], [place], [address] Phone: [phone]   Fax:  [phone]  Physical Therapy Treatment  Patient Details  Name: [name] MRN: [id number] Date of Birth: 1940/04/24 Referring Provider (PT): Dr. August Saucer   Encounter Date: 07/01/2020   PT End of Session - 07/01/20 1336    Visit Number 7    Number of Visits 20    Date for PT Re-Evaluation 08/18/20    Authorization Type Medicare    Progress Note Due on Visit 10    PT Start Time 1337    PT Stop Time 1427    PT Time Calculation (min) 50 min    Activity Tolerance Patient tolerated treatment well    Behavior During Therapy [facility] for tasks assessed/performed           Past Medical History:  Diagnosis Date  . CHF (congestive heart failure) (HCC)   . Coronary artery disease   . Dysrhythmia    a-fib  . HLD (hyperlipidemia)   . Hypertension   . MSSA (methicillin susceptible Staphylococcus aureus) infection 05/26/2020    Past Surgical History:  Procedure Laterality Date  . ATRIAL FIBRILLATION ABLATION    . CHOLECYSTECTOMY    . COLONOSCOPY    . EYE SURGERY Bilateral    cataractions removed  . HARDWARE REMOVAL Left 04/26/2020   Procedure: LEFT ANKLE IRRIGATION AND DEBRIDEMENT, HARDWARE REMOVAL;  Surgeon: Cammy Copa, MD;  Location: [facility] OR;  Service: Orthopedics;  Laterality: Left;  . ORIF ANKLE FRACTURE Left 03/16/2020   Procedure: OPEN REDUCTION INTERNAL FIXATION (ORIF) LEFT ANKLE FRACTURE;  Surgeon: Cammy Copa, MD;  Location: [facility] OR;  Service: Orthopedics;  Laterality: Left;    There were no vitals filed for this visit.   Subjective Assessment - 07/01/20 1340    Subjective Pt. stated no pain today or over last few days.  Pt. stated sore at times after exercise but better with ice.    Patient Stated Goals Wants to do things independently, walking independently without restriction.    Currently in Pain? No/denies    Pain Location Ankle    Pain  Onset More than a month ago              [facility] PT Assessment - 07/01/20 0001      Assessment   Medical Diagnosis Fracture Lt malleolus c surgery    Referring Provider (PT) Dr. August Saucer    Onset Date/Surgical Date 03/16/20    Hand Dominance Right      Observation/Other Assessments   Focus on Therapeutic Outcomes (FOTO)  intake 56%, expected outcome 53%      Single Leg Stance   Comments Lt LE 5 seconds      AROM   Left Ankle Dorsiflexion 9    Left Ankle Plantar Flexion 38    Left Ankle Inversion 15    Left Ankle Eversion 15      Strength   Left Ankle Dorsiflexion 5/5    Left Ankle Plantar Flexion 2+/5   unable to perform heel raise standing single leg   Left Ankle Inversion 4+/5    Left Ankle Eversion 5/5      Ambulation/Gait   Gait Comments SPC at home and in clinic.                         [facility] Adult PT Treatment/Exercise - 07/01/20 0001      Neuro Re-ed    Neuro Re-ed Details  SLS 30 sec x 4 moderate HHA on bar performed bilateral, fitter rocker board fwd/back 30x, side to side 30 x, retro step onto Lt LE 20x, lateral stepping 3 cones x 5 each bilateral      Vasopneumatic   Number Minutes Vasopneumatic  10 minutes    Vasopnuematic Location  Ankle   in elevation   Vasopneumatic Pressure High    Vasopneumatic Temperature  34      Ankle Exercises: Stretches   Gastroc Stretch 30 seconds;3 reps   runner stretch incline board Lt LE posterior     Ankle Exercises: Seated   Other Seated Ankle Exercises 18 inch chair sit to stand no UE x 10      Ankle Exercises: Standing   Other Standing Ankle Exercises 4 inch step up on Lt LE x 15 finger tip touch      Ankle Exercises: Aerobic   Recumbent Bike Lvl 3 5 mins seat 6                    PT Short Term Goals - 06/16/20 1449      PT SHORT TERM GOAL #1   Title Patient will demonstrate independent use of home exercise program to maintain progress from in clinic treatments.    Time 3    Period  Weeks    Status Achieved    Target Date 06/30/20             PT Long Term Goals - 06/16/20 1449      PT LONG TERM GOAL #1   Title Patient will demonstrate/report pain at worst less than or equal to 2/10 to facilitate minimal limitation in daily activity secondary to pain symptoms.    Time 10    Period Weeks    Status On-going      PT LONG TERM GOAL #2   Title Patient will demonstrate independent use of home exercise program to facilitate ability to maintain/progress functional gains from skilled physical therapy services.    Time 10    Period Weeks    Status On-going      PT LONG TERM GOAL #3   Title Pt. will demonstrate Lt ankle AROM equal to Rt to facilitate usual mobility, walking, transfers at PLOF s limitation.    Time 10    Period Weeks    Status On-going      PT LONG TERM GOAL #4   Title Pt. will demonstrate Lt LE MMT 5/5 throughout to faciltiate usual standing, walking, stairs at PLOF s limitation.    Time 10    Period Weeks    Status On-going      PT LONG TERM GOAL #5   Title Pt. will demonstrate independent ambulation s deviation for daily mobility at PLOF.    Time 10    Period Weeks    Status On-going      PT LONG TERM GOAL #6   Title Pt. will demonstrate FOTO outcome >   = 53 to indicate reduced disability.    Time 10    Period Weeks    Status On-going      PT LONG TERM GOAL #7   Title Pt. will demonstrate ability to ascend/descend stairs c reciprocal gait pattern.    Time 10    Period Weeks    Status On-going                 Plan - 07/01/20 1348    Clinical Impression Statement Continued  deficits in movement coordination which impairs stability in ambulation but showing some early improvement c transition of in clinic therapy to include more balance intervention.  Strength deficits for PF still noted as well.    Personal Factors and Comorbidities Comorbidity 3+    Comorbidities CHF, HTN, MSSA 05/26/2020    Examination-Activity Limitations  Squat;Stairs;Stand;Transfers;Locomotion Level    Examination-Participation Restrictions Community Activity;Meal Prep    Stability/Clinical Decision Making Stable/Uncomplicated    Rehab Potential Good    PT Frequency 2x / week    PT Duration Other (comment)    PT Treatment/Interventions ADLs/Self Care Home Management;Cryotherapy;Electrical Stimulation;Iontophoresis 4mg /ml Dexamethasone;Moist Heat;Balance training;Therapeutic exercise;Therapeutic activities;Wheelchair mobility training;Functional mobility training;Stair training;Gait training;DME Instruction;Ultrasound;Neuromuscular re-education;Patient/family education;Passive range of motion;Dry needling;Joint Manipulations;Taping;Vasopneumatic Device;Manual techniques    PT Next Visit Plan DF mobilization, ap talocrural jt mobs c movement for DF mobility gains, progression through non compliant to compliant surface balance.    PT Home Exercise Plan [id number]    Consulted and Agree with Plan of Care Patient           Patient will benefit from skilled therapeutic intervention in order to improve the following deficits and impairments:  Abnormal gait,Decreased endurance,Hypomobility,Pain,Decreased strength,Decreased activity tolerance,Decreased balance,Difficulty walking,Decreased mobility,Improper body mechanics,Impaired perceived functional ability,Impaired flexibility,Decreased coordination,Decreased range of motion,Increased edema  Visit Diagnosis: Pain in left ankle and joints of left foot  Muscle weakness (generalized)  Localized edema  Difficulty in walking, not elsewhere classified     Problem List Patient Active Problem List   Diagnosis Date Noted  . MSSA (methicillin susceptible Staphylococcus aureus) infection 05/26/2020  . 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 Congestive heart failure (HCC)   . Coronary artery disease involving native heart without angina pectoris   . Hardware complicating wound infection (HCC)     04/28/2020, PT, DPT, OCS, ATC 07/01/20  2:14 PM     [facility] [address] [place], [place], [place] Phone: [phone]   Fax:  [phone]  Name: [name] MRN: Cherlynn June Date of Birth: [birth year]

## 2020-07-02 ENCOUNTER — Ambulatory Visit (INDEPENDENT_AMBULATORY_CARE_PROVIDER_SITE_OTHER): Payer: Medicare Other | Admitting: Rehabilitative and Restorative Service Providers"

## 2020-07-02 ENCOUNTER — Encounter: Payer: Self-pay | Admitting: Rehabilitative and Restorative Service Providers"

## 2020-07-02 DIAGNOSIS — R600 Localized edema: Secondary | ICD-10-CM | POA: Diagnosis not present

## 2020-07-02 DIAGNOSIS — M6281 Muscle weakness (generalized): Secondary | ICD-10-CM

## 2020-07-02 DIAGNOSIS — M25572 Pain in left ankle and joints of left foot: Secondary | ICD-10-CM

## 2020-07-02 DIAGNOSIS — R262 Difficulty in walking, not elsewhere classified: Secondary | ICD-10-CM | POA: Diagnosis not present

## 2020-07-02 NOTE — Therapy (Signed)
[facility] Physical Therapy [address] [place], [place], [address] Phone: [phone]   Fax:  [phone]  Physical Therapy Treatment  Patient Details  Name: [name] MRN: [id number] Date of Birth: 21-Aug-1940 Referring Provider (PT): Dr. August Saucer   Encounter Date: 07/02/2020   PT End of Session - 07/02/20 1149    Visit Number 8    Number of Visits 20    Date for PT Re-Evaluation 08/18/20    Authorization Type Medicare    Progress Note Due on Visit 10    PT Start Time 1144    PT Stop Time 1223    PT Time Calculation (min) 39 min    Activity Tolerance Patient tolerated treatment well    Behavior During Therapy [facility] for tasks assessed/performed           Past Medical History:  Diagnosis Date  . CHF (congestive heart failure) (HCC)   . Coronary artery disease   . Dysrhythmia    a-fib  . HLD (hyperlipidemia)   . Hypertension   . MSSA (methicillin susceptible Staphylococcus aureus) infection 05/26/2020    Past Surgical History:  Procedure Laterality Date  . ATRIAL FIBRILLATION ABLATION    . CHOLECYSTECTOMY    . COLONOSCOPY    . EYE SURGERY Bilateral    cataractions removed  . HARDWARE REMOVAL Left 04/26/2020   Procedure: LEFT ANKLE IRRIGATION AND DEBRIDEMENT, HARDWARE REMOVAL;  Surgeon: Cammy Copa, MD;  Location: [facility] OR;  Service: Orthopedics;  Laterality: Left;  . ORIF ANKLE FRACTURE Left 03/16/2020   Procedure: OPEN REDUCTION INTERNAL FIXATION (ORIF) LEFT ANKLE FRACTURE;  Surgeon: Cammy Copa, MD;  Location: [facility] OR;  Service: Orthopedics;  Laterality: Left;    There were no vitals filed for this visit.   Subjective Assessment - 07/02/20 1148    Subjective Pt. indicated feeling some leg tightness and tired from yesterday, some general sore in ankle.    Patient Stated Goals Wants to do things independently, walking independently without restriction.    Currently in Pain? No/denies   sore reported   Pain Location Ankle    Pain  Orientation Left    Pain Descriptors / Indicators Sore    Pain Type Surgical pain    Pain Onset More than a month ago    Pain Frequency Intermittent    Aggravating Factors  post activity soreness, stiff today in morning upon waking    Pain Relieving Factors exercise has helped movement                             [facility] Adult PT Treatment/Exercise - 07/02/20 0001      Neuro Re-ed    Neuro Re-ed Details  tandem ambulation fwd/back 10 ft in // bars x 5 each way, fwd/reverse ambulation c step length increase focus 10 ft x 6 each way, fitter rocker board fwd/back 30 x each way      Ankle Exercises: Aerobic   Nustep Lvl 5 12 mins      Ankle Exercises: Seated   Other Seated Ankle Exercises 18 inch chair sit to stand no UE x 10      Ankle Exercises: Standing   Other Standing Ankle Exercises ascending/descending flight of stairs x 2 c single rail assist, reciprocal gait pattern    Other Standing Ankle Exercises PF/DF rocking 3 x 10 each way  PT Short Term Goals - 06/16/20 1449      PT SHORT TERM GOAL #1   Title Patient will demonstrate independent use of home exercise program to maintain progress from in clinic treatments.    Time 3    Period Weeks    Status Achieved    Target Date 06/30/20             PT Long Term Goals - 06/16/20 1449      PT LONG TERM GOAL #1   Title Patient will demonstrate/report pain at worst less than or equal to 2/10 to facilitate minimal limitation in daily activity secondary to pain symptoms.    Time 10    Period Weeks    Status On-going      PT LONG TERM GOAL #2   Title Patient will demonstrate independent use of home exercise program to facilitate ability to maintain/progress functional gains from skilled physical therapy services.    Time 10    Period Weeks    Status On-going      PT LONG TERM GOAL #3   Title Pt. will demonstrate Lt ankle AROM equal to Rt to facilitate usual mobility, walking,  transfers at PLOF s limitation.    Time 10    Period Weeks    Status On-going      PT LONG TERM GOAL #4   Title Pt. will demonstrate Lt LE MMT 5/5 throughout to faciltiate usual standing, walking, stairs at PLOF s limitation.    Time 10    Period Weeks    Status On-going      PT LONG TERM GOAL #5   Title Pt. will demonstrate independent ambulation s deviation for daily mobility at PLOF.    Time 10    Period Weeks    Status On-going      PT LONG TERM GOAL #6   Title Pt. will demonstrate FOTO outcome >   = 53 to indicate reduced disability.    Time 10    Period Weeks    Status On-going      PT LONG TERM GOAL #7   Title Pt. will demonstrate ability to ascend/descend stairs c reciprocal gait pattern.    Time 10    Period Weeks    Status On-going                 Plan - 07/02/20 1204    Clinical Impression Statement Partial reliance on HHA corrections for balance intervention but required less frequently than previously noted.  Not yet stable enough for independent ambulation in community, [facility] still recommended.    Personal Factors and Comorbidities Comorbidity 3+    Comorbidities CHF, HTN, MSSA 05/26/2020    Examination-Activity Limitations Squat;Stairs;Stand;Transfers;Locomotion Level    Examination-Participation Restrictions Community Activity;Meal Prep    Stability/Clinical Decision Making Stable/Uncomplicated    Rehab Potential Good    PT Frequency 2x / week    PT Duration Other (comment)    PT Treatment/Interventions ADLs/Self Care Home Management;Cryotherapy;Electrical Stimulation;Iontophoresis 4mg /ml Dexamethasone;Moist Heat;Balance training;Therapeutic exercise;Therapeutic activities;Wheelchair mobility training;Functional mobility training;Stair training;Gait training;DME Instruction;Ultrasound;Neuromuscular re-education;Patient/family education;Passive range of motion;Dry needling;Joint Manipulations;Taping;Vasopneumatic Device;Manual techniques    PT Next Visit  Plan DF mobilization, ap talocrural jt mobs c movement for DF mobility gains, progression through non compliant to compliant surface balance.    PT Home Exercise Plan [email or web address]    Consulted and Agree with Plan of Care Patient           Patient will benefit from skilled therapeutic  intervention in order to improve the following deficits and impairments:  Abnormal gait,Decreased endurance,Hypomobility,Pain,Decreased strength,Decreased activity tolerance,Decreased balance,Difficulty walking,Decreased mobility,Improper body mechanics,Impaired perceived functional ability,Impaired flexibility,Decreased coordination,Decreased range of motion,Increased edema  Visit Diagnosis: Pain in left ankle and joints of left foot  Muscle weakness (generalized)  Localized edema  Difficulty in walking, not elsewhere classified     Problem List Patient Active Problem List   Diagnosis Date Noted  . MSSA (methicillin susceptible Staphylococcus aureus) infection 05/26/2020  . 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 Congestive heart failure (HCC)   . Coronary artery disease involving native heart without angina pectoris   . Hardware complicating wound infection (HCC)     Chyrel Masson, PT, DPT, OCS, ATC 07/02/20  12:12 PM    [facility] Physical Therapy [address] [place], [place], [address] Phone: [phone]   Fax:  [phone]  Name: [name] MRN: [id number] Date of Birth: [birth year]

## 2020-07-06 ENCOUNTER — Encounter: Payer: Self-pay | Admitting: Rehabilitative and Restorative Service Providers"

## 2020-07-06 ENCOUNTER — Other Ambulatory Visit: Payer: Self-pay

## 2020-07-06 ENCOUNTER — Ambulatory Visit (INDEPENDENT_AMBULATORY_CARE_PROVIDER_SITE_OTHER): Payer: Medicare Other | Admitting: Rehabilitative and Restorative Service Providers"

## 2020-07-06 ENCOUNTER — Telehealth: Payer: Self-pay | Admitting: Orthopedic Surgery

## 2020-07-06 DIAGNOSIS — M6281 Muscle weakness (generalized): Secondary | ICD-10-CM | POA: Diagnosis not present

## 2020-07-06 DIAGNOSIS — M25672 Stiffness of left ankle, not elsewhere classified: Secondary | ICD-10-CM | POA: Diagnosis not present

## 2020-07-06 DIAGNOSIS — R262 Difficulty in walking, not elsewhere classified: Secondary | ICD-10-CM | POA: Diagnosis not present

## 2020-07-06 DIAGNOSIS — R600 Localized edema: Secondary | ICD-10-CM | POA: Diagnosis not present

## 2020-07-06 NOTE — Telephone Encounter (Signed)
Can you please write order for this?

## 2020-07-06 NOTE — Therapy (Signed)
[facility] Physical Therapy [address] [place], [place], [address] Phone: [phone]   Fax:  [phone]  Physical Therapy Treatment  Patient Details  Name: [name] MRN: [id number] Date of Birth: 08/18/1940 Referring Provider (PT): Dr. August Saucer   Encounter Date: 07/06/2020   PT End of Session - 07/06/20 1157    Visit Number 9    Number of Visits 20    Date for PT Re-Evaluation 08/18/20    Authorization Type Medicare    Progress Note Due on Visit 10    PT Start Time 1102    PT Stop Time 1146    PT Time Calculation (min) 44 min    Activity Tolerance Patient tolerated treatment well;No increased pain    Behavior During Therapy WFL for tasks assessed/performed           Past Medical History:  Diagnosis Date  . CHF (congestive heart failure) (HCC)   . Coronary artery disease   . Dysrhythmia    a-fib  . HLD (hyperlipidemia)   . Hypertension   . MSSA (methicillin susceptible Staphylococcus aureus) infection 05/26/2020    Past Surgical History:  Procedure Laterality Date  . ATRIAL FIBRILLATION ABLATION    . CHOLECYSTECTOMY    . COLONOSCOPY    . EYE SURGERY Bilateral    cataractions removed  . HARDWARE REMOVAL Left 04/26/2020   Procedure: LEFT ANKLE IRRIGATION AND DEBRIDEMENT, HARDWARE REMOVAL;  Surgeon: Cammy Copa, MD;  Location: [facility] OR;  Service: Orthopedics;  Laterality: Left;  . ORIF ANKLE FRACTURE Left 03/16/2020   Procedure: OPEN REDUCTION INTERNAL FIXATION (ORIF) LEFT ANKLE FRACTURE;  Surgeon: Cammy Copa, MD;  Location: [facility] OR;  Service: Orthopedics;  Laterality: Left;    There were no vitals filed for this visit.   Subjective Assessment - 07/06/20 1155    Subjective [name] is very happy with her PT progress.  She would like to get stronger and feel more stable and she mentioned wanting to get a referral to continue PT in [place] when she returns home next week.    Patient Stated Goals Wants to do things independently,  walking independently without restriction.    Currently in Pain? No/denies    Pain Onset More than a month ago                             [facility] Adult PT Treatment/Exercise - 07/06/20 0001      Neuro Re-ed    Neuro Re-ed Details  Static and dynamic heel to toe balance      Exercises   Exercises Ankle      Ankle Exercises: Stretches   Soleus Stretch 3 reps;20 seconds;Other (comment)   Standing with knees bent   Gastroc Stretch 3 reps;20 seconds;Other (comment)   B   Slant Board Stretch 60 seconds;Other (comment)   Knees straight and bent     Ankle Exercises: Aerobic   Nustep Level 5 for 8 minutes      Ankle Exercises: Standing   Other Standing Ankle Exercises Hip hike in doorway 2 sets of 10 3 second hold    Other Standing Ankle Exercises Standing heel to toe raises with 3 second hold and slow eccentrics 2 sets of 10      Ankle Exercises: Seated   Other Seated Ankle Exercises Sit to stand slow eccentrics 10X                  PT  Education - 07/06/20 1156    Education Details Updated HEP.  Reviewed most important exercises for dorsiflexion AROM and ankle strength.    Person(s) Educated Patient    Methods Explanation;Demonstration;Tactile cues;Verbal cues;Handout    Comprehension Tactile cues required;Need further instruction;Verbal cues required;Returned demonstration;Verbalized understanding            PT Short Term Goals - 07/06/20 1156      PT SHORT TERM GOAL #1   Title Patient will demonstrate independent use of home exercise program to maintain progress from in clinic treatments.    Time 3    Period Weeks    Status Achieved    Target Date 06/30/20             PT Long Term Goals - 07/06/20 1157      PT LONG TERM GOAL #1   Title Patient will demonstrate/report pain at worst less than or equal to 2/10 to facilitate minimal limitation in daily activity secondary to pain symptoms.    Time 10    Period Weeks    Status Achieved       PT LONG TERM GOAL #2   Title Patient will demonstrate independent use of home exercise program to facilitate ability to maintain/progress functional gains from skilled physical therapy services.    Time 10    Period Weeks    Status On-going      PT LONG TERM GOAL #3   Title Pt. will demonstrate Lt ankle AROM equal to Rt to facilitate usual mobility, walking, transfers at PLOF s limitation.    Time 10    Period Weeks    Status On-going      PT LONG TERM GOAL #4   Title Pt. will demonstrate Lt LE MMT 5/5 throughout to faciltiate usual standing, walking, stairs at PLOF s limitation.    Time 10    Period Weeks    Status On-going      PT LONG TERM GOAL #5   Title Pt. will demonstrate independent ambulation s deviation for daily mobility at PLOF.    Time 10    Period Weeks    Status On-going      PT LONG TERM GOAL #6   Title Pt. will demonstrate FOTO outcome >   = 53 to indicate reduced disability.    Time 10    Period Weeks    Status On-going      PT LONG TERM GOAL #7   Title Pt. will demonstrate ability to ascend/descend stairs c reciprocal gait pattern.    Time 10    Period Weeks    Status On-going                 Plan - 07/06/20 1158    Clinical Impression Statement [name] is happy with her overall progress with supervised PT.  We reviewed her long-term HEP with emphasis on dorsiflexion AROM and ankle strength.  She would like to continue supervised PT in [place] to continue to comfortably transition off the cane and into independent rehabilitation.    Personal Factors and Comorbidities Comorbidity 3+    Comorbidities CHF, HTN, MSSA 05/26/2020    Examination-Activity Limitations Squat;Stairs;Stand;Transfers;Locomotion Level    Examination-Participation Restrictions Community Activity;Meal Prep    Stability/Clinical Decision Making Stable/Uncomplicated    Rehab Potential Good    PT Frequency 2x / week    PT Duration Other (comment)    PT Treatment/Interventions  ADLs/Self Care Home Management;Cryotherapy;Electrical Stimulation;Iontophoresis 4mg /ml Dexamethasone;Moist Heat;Balance training;Therapeutic exercise;Therapeutic activities;Wheelchair  mobility training;Functional mobility training;Stair training;Gait training;DME Instruction;Ultrasound;Neuromuscular re-education;Patient/family education;Passive range of motion;Dry needling;Joint Manipulations;Taping;Vasopneumatic Device;Manual techniques    PT Next Visit Plan DF mobilization, ap talocrural jt mobs c movement for DF mobility gains, progression through non compliant to compliant surface balance.    PT Home Exercise Plan [email or web address]    Consulted and Agree with Plan of Care Patient           Patient will benefit from skilled therapeutic intervention in order to improve the following deficits and impairments:  Abnormal gait,Decreased endurance,Hypomobility,Pain,Decreased strength,Decreased activity tolerance,Decreased balance,Difficulty walking,Decreased mobility,Improper body mechanics,Impaired perceived functional ability,Impaired flexibility,Decreased coordination,Decreased range of motion,Increased edema  Visit Diagnosis: Difficulty in walking, not elsewhere classified  Stiffness of left ankle, not elsewhere classified  Muscle weakness (generalized)  Localized edema     Problem List Patient Active Problem List   Diagnosis Date Noted  . MSSA (methicillin susceptible Staphylococcus aureus) infection 05/26/2020  . 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 Congestive heart failure (HCC)   . Coronary artery disease involving native heart without angina pectoris   . Hardware complicating wound infection (HCC)     Cherlyn Cushing PT, MPT 07/06/2020, 12:00 PM  [facility] Physical Therapy [address] [place], [place], [address] Phone: [phone]   Fax:  [phone]  Name: [name] MRN: [id number] Date of Birth: Sep 10, 1940

## 2020-07-06 NOTE — Patient Instructions (Signed)
Access Code: [email or web address] URL: [email or web address] Date: 07/06/2020 Prepared by: Pauletta Browns  Exercises Gastroc Stretch on Wall - 2 x daily - 5 reps - 30 sec hold Standing Single Leg Stance with Counter Support - 2 x daily - 5 reps - 30 sec hold Seated Ankle Eversion with Resistance - 2 x daily - 3 sets - 10 reps Seated Ankle Plantar Flexion with Resistance Loop - 2 x daily - 3 sets - 10 reps Standing Heel Raises - 2 x daily - 2 sets - 10 reps Heel Toe Raises with Counter Support - 2 x daily - 2 sets - 10 reps Standing Dorsiflexion Self-Mobilization on Step - 2 x daily - 7 x weekly - 10 reps - 3 sets - 2-3 hold Retro Step - 2 x daily - 7 x weekly - 1 sets - 20 reps Standing Gastroc Stretch - 1-2 x daily - 7 x weekly - 1 sets - 5 reps - 20 seconds hold Slant Board Gastrocnemius Stretch - 1-2 x daily - 7 x weekly - 1 sets - 3 reps - 60 seconds hold Standing Soleus Stretch - 1-2 x daily - 7 x weekly - 1 sets - 5 reps - 20 seconds hold Heel Toe Raises with Counter Support - 3-5 x daily - 7 x weekly - 1 sets - 10 reps - 3 seconds hold Slant Board Soleus Stretch - 1-2 x daily - 7 x weekly - 1 sets - 3 reps - 60 seconds hold Standing Hip Hiking - 1-2 x daily - 7 x weekly - 2 sets - 10 reps - 3 seconds hold Sit to Stand with Armchair - 1-2 x daily - 7 x weekly - 1 sets - 10 reps

## 2020-07-06 NOTE — Telephone Encounter (Signed)
Patient will leaving on Friday April 8th to return to [place] and states she will need a referral to continue physical therapy once she's home in [place].  Pt has her last physical therapy appointment upstairs on 07/08/20 and would like to pick up the new referral at that time.   Pt's contact # [phone]

## 2020-07-07 NOTE — Telephone Encounter (Signed)
Done.  Prescription on your desk.  Thanks

## 2020-07-08 ENCOUNTER — Ambulatory Visit (INDEPENDENT_AMBULATORY_CARE_PROVIDER_SITE_OTHER): Payer: Medicare Other | Admitting: Rehabilitative and Restorative Service Providers"

## 2020-07-08 ENCOUNTER — Encounter: Payer: Self-pay | Admitting: Rehabilitative and Restorative Service Providers"

## 2020-07-08 ENCOUNTER — Other Ambulatory Visit: Payer: Self-pay

## 2020-07-08 DIAGNOSIS — R262 Difficulty in walking, not elsewhere classified: Secondary | ICD-10-CM | POA: Diagnosis not present

## 2020-07-08 DIAGNOSIS — M25572 Pain in left ankle and joints of left foot: Secondary | ICD-10-CM

## 2020-07-08 DIAGNOSIS — M6281 Muscle weakness (generalized): Secondary | ICD-10-CM | POA: Diagnosis not present

## 2020-07-08 DIAGNOSIS — R600 Localized edema: Secondary | ICD-10-CM

## 2020-07-08 NOTE — Therapy (Signed)
[facility] Therapy [address] [place], [place], [address] Phone: [phone]   Fax:  [phone]  Physical Therapy Treatment/Discharge  Patient Details  Name: [name] MRN: [id number] Date of Birth: 11-29-1940 Referring Provider (PT): Dr. Marlou Sa   Encounter Date: 07/08/2020   PHYSICAL THERAPY DISCHARGE SUMMARY  Visits from Start of Care: 10  Current functional level related to goals / functional outcomes: See note   Remaining deficits: See note   Education / Equipment: HEP Plan: Patient agrees to discharge.  Patient goals were partially met. Patient is being discharged due to the patient's request.  ?????   D/C due to return home to [place].     PT End of Session - 07/08/20 1047    Visit Number 10    Date for PT Re-Evaluation 08/18/20    Authorization Type Medicare    Progress Note Due on Visit 10    PT Start Time 9826    PT Stop Time 1123    PT Time Calculation (min) 40 min    Activity Tolerance Patient tolerated treatment well    Behavior During Therapy WFL for tasks assessed/performed           Past Medical History:  Diagnosis Date  . CHF (congestive heart failure) ([place])   . Coronary artery disease   . Dysrhythmia    a-fib  . HLD (hyperlipidemia)   . Hypertension   . MSSA (methicillin susceptible Staphylococcus aureus) infection 05/26/2020    Past Surgical History:  Procedure Laterality Date  . ATRIAL FIBRILLATION ABLATION    . CHOLECYSTECTOMY    . COLONOSCOPY    . EYE SURGERY Bilateral    cataractions removed  . HARDWARE REMOVAL Left 04/26/2020   Procedure: LEFT ANKLE IRRIGATION AND DEBRIDEMENT, HARDWARE REMOVAL;  Surgeon: Meredith Pel, MD;  Location: [place];  Service: Orthopedics;  Laterality: Left;  . ORIF ANKLE FRACTURE Left 03/16/2020   Procedure: OPEN REDUCTION INTERNAL FIXATION (ORIF) LEFT ANKLE FRACTURE;  Surgeon: Meredith Pel, MD;  Location: [place];  Service: Orthopedics;  Laterality: Left;     There were no vitals filed for this visit.       [facility] PT Assessment - 07/08/20 0001      Assessment   Medical Diagnosis Fracture Lt malleolus c surgery    Referring Provider (PT) Dr. Marlou Sa    Onset Date/Surgical Date 03/16/20    Hand Dominance Right      AROM   Left Ankle Dorsiflexion 9    Left Ankle Plantar Flexion 38    Left Ankle Inversion 15    Left Ankle Eversion 15      Strength   Left Ankle Dorsiflexion 5/5    Left Ankle Plantar Flexion 2+/5   unable to perform SL raise in standing   Left Ankle Inversion 5/5    Left Ankle Eversion 5/5      Ambulation/Gait   Gait Comments SPC in clinic, reported periods of time s cane at home                         [facility] Adult PT Treatment/Exercise - 07/08/20 0001      Neuro Re-ed    Neuro Re-ed Details  tandem ambulation on foam fwd 8 ft x 10 in // bars, tandem stance on foam 1 min x 1 bilateral in // bars, heel to toe rocking occasional HHA x 20 each      Ankle Exercises: Aerobic   Nustep Lvl  6 10 mins      Ankle Exercises: Standing   Other Standing Ankle Exercises lateral step down 2 x 10 each 6 inch box    Other Standing Ankle Exercises Standing heel to toe raises with 3 second hold and slow eccentrics 2 sets of 10      Ankle Exercises: Stretches   Gastroc Stretch 30 seconds;3 reps   runner stretch Lt LE posterior                 PT Education - 07/08/20 1118    Education Details Reviewed HEP for D/C.  Noted on previous visit, duplicates of exercies were included on HEP printout so removed and reprinted for simplification.    Person(s) Educated Patient    Methods Explanation;Demonstration;Handout;Verbal cues    Comprehension Verbalized understanding;Returned demonstration            PT Short Term Goals - 07/06/20 1156      PT SHORT TERM GOAL #1   Title Patient will demonstrate independent use of home exercise program to maintain progress from in clinic treatments.    Time 3    Period  Weeks    Status Achieved    Target Date 06/30/20             PT Long Term Goals - 07/08/20 1058      PT LONG TERM GOAL #1   Title Patient will demonstrate/report pain at worst less than or equal to 2/10 to facilitate minimal limitation in daily activity secondary to pain symptoms.    Time 10    Period Weeks    Status Achieved      PT LONG TERM GOAL #2   Title Patient will demonstrate independent use of home exercise program to facilitate ability to maintain/progress functional gains from skilled physical therapy services.    Time 10    Period Weeks    Status Achieved      PT LONG TERM GOAL #3   Title Pt. will demonstrate Lt ankle AROM equal to Rt to facilitate usual mobility, walking, transfers at PLOF s limitation.    Time 10    Period Weeks    Status Partially Met      PT LONG TERM GOAL #4   Title Pt. will demonstrate Lt LE MMT 5/5 throughout to faciltiate usual standing, walking, stairs at PLOF s limitation.    Time 10    Period Weeks    Status Partially Met      PT LONG TERM GOAL #5   Title Pt. will demonstrate independent ambulation s deviation for daily mobility at PLOF.    Time 10    Period Weeks    Status Partially Met      PT LONG TERM GOAL #6   Title Pt. will demonstrate FOTO outcome >   = 53 to indicate reduced disability.    Time 10    Period Weeks    Status Achieved      PT LONG TERM GOAL #7   Title Pt. will demonstrate ability to ascend/descend stairs c reciprocal gait pattern.    Time 10    Period Weeks    Status Partially Met                 Plan - 07/08/20 1048    Clinical Impression Statement Pt. has attended 10 visits overall during course of treatment.  See objective data for updated information.  Pt. has demonstrated documented gains in areas towards goals but  does continue to present c impairments that limit independent ambulation, reciprocal gait on stairs, and usual daily activity.  Pt. to return to [place] (home) c plan to continue  skilled PT services there for continued gains.    Personal Factors and Comorbidities Comorbidity 3+    Comorbidities CHF, HTN, MSSA 05/26/2020    Examination-Activity Limitations Squat;Stairs;Stand;Transfers;Locomotion Level    Examination-Participation Restrictions Community Activity;Meal Prep    Stability/Clinical Decision Making Stable/Uncomplicated    PT Treatment/Interventions ADLs/Self Care Home Management;Cryotherapy;Electrical Stimulation;Iontophoresis 71m/ml Dexamethasone;Moist Heat;Balance training;Therapeutic exercise;Therapeutic activities;Wheelchair mobility training;Functional mobility training;Stair training;Gait training;DME Instruction;Ultrasound;Neuromuscular re-education;Patient/family education;Passive range of motion;Dry needling;Joint Manipulations;Taping;Vasopneumatic Device;Manual techniques    PT Next Visit Plan [place]           Patient will benefit from skilled therapeutic intervention in order to improve the following deficits and impairments:  Abnormal gait,Decreased endurance,Hypomobility,Pain,Decreased strength,Decreased activity tolerance,Decreased balance,Difficulty walking,Decreased mobility,Improper body mechanics,Impaired perceived functional ability,Impaired flexibility,Decreased coordination,Decreased range of motion,Increased edema  Visit Diagnosis: Pain in left ankle and joints of left foot  Muscle weakness (generalized)  Localized edema  Difficulty in walking, not elsewhere classified     Problem List Patient Active Problem List   Diagnosis Date Noted  . MSSA (methicillin susceptible Staphylococcus aureus) infection 05/26/2020  . [place] 04[place]  . [place]   . Coronary artery disease involving native heart without angina pectoris   . Hardware complicating wound infection ([place]      MScot Jun PT, DPT, OCS, ATC 07/08/20  11:26 AM    [facility] [address] [address]Phone: [phone]  Fax:  [phone] Name: FYazlyn WentzelMRN: [id number] of Birth: 831-Jul-1942

## 2020-07-08 NOTE — Patient Instructions (Signed)
Access Code: [email or web address] URL: [email or web address] Date: 07/08/2020 Prepared by: Chyrel Masson  Exercises Gastroc Stretch on Wall - 2 x daily - 5 reps - 30 sec hold Standing Single Leg Stance with Counter Support - 2 x daily - 5 reps - 30 sec hold Seated Ankle Eversion with Resistance - 2 x daily - 3 sets - 10 reps Seated Ankle Plantar Flexion with Resistance Loop - 2 x daily - 3 sets - 10 reps Standing Heel Raises - 2 x daily - 2 sets - 10 reps Standing Dorsiflexion Self-Mobilization on Step - 2 x daily - 7 x weekly - 10 reps - 3 sets - 2-3 hold Retro Step - 2 x daily - 7 x weekly - 1 sets - 20 reps Heel Toe Raises with Counter Support - 3-5 x daily - 7 x weekly - 1 sets - 10 reps - 3 seconds hold Standing Hip Hiking - 1-2 x daily - 7 x weekly - 2 sets - 10 reps - 3 seconds hold Sit to Stand - 1 x daily - 7 x weekly - 3 sets - 10 reps

## 2020-07-13 ENCOUNTER — Encounter: Payer: Medicare Other | Admitting: Rehabilitative and Restorative Service Providers"

## 2020-07-15 ENCOUNTER — Encounter: Payer: Medicare Other | Admitting: Rehabilitative and Restorative Service Providers"

## 2020-09-08 ENCOUNTER — Telehealth: Payer: Self-pay | Admitting: Orthopedic Surgery

## 2020-09-08 NOTE — Telephone Encounter (Signed)
Pt called wanting to get her referral for pt to extend her physical therapy. When incident happened pt was in [place] and has now gone back to [place] and would like to get it sent up there to a facility ([facility], fax#[phone]). Pt's number is [phone].

## 2020-09-13 ENCOUNTER — Telehealth: Payer: Self-pay | Admitting: Orthopedic Surgery

## 2020-09-13 NOTE — Telephone Encounter (Signed)
Okay for range of motion left ankle and gait training as tolerated weightbearing as tolerated procedure admission 2 times a week for 6 weeks

## 2020-09-13 NOTE — Telephone Encounter (Signed)
Pt called about getting more PT visits sent to [facility], fax# [phone]. She would like an e-mail conformation when finished. I confirmed e-mail we have on file. Today is her last physical therapy appt.   [email or web address]

## 2020-09-13 NOTE — Telephone Encounter (Signed)
faxed

## 2021-09-22 ENCOUNTER — Encounter: Payer: Self-pay | Admitting: Infectious Diseases

## 2021-12-19 IMAGING — DX DG ANKLE COMPLETE 3+V*L*
3 series · 3 of 3 positions shown · non-contrast
Comparison: None.

CLINICAL DATA: Fall down steps

EXAM:
LEFT ANKLE COMPLETE - 3+ VIEW

[ankle ap]
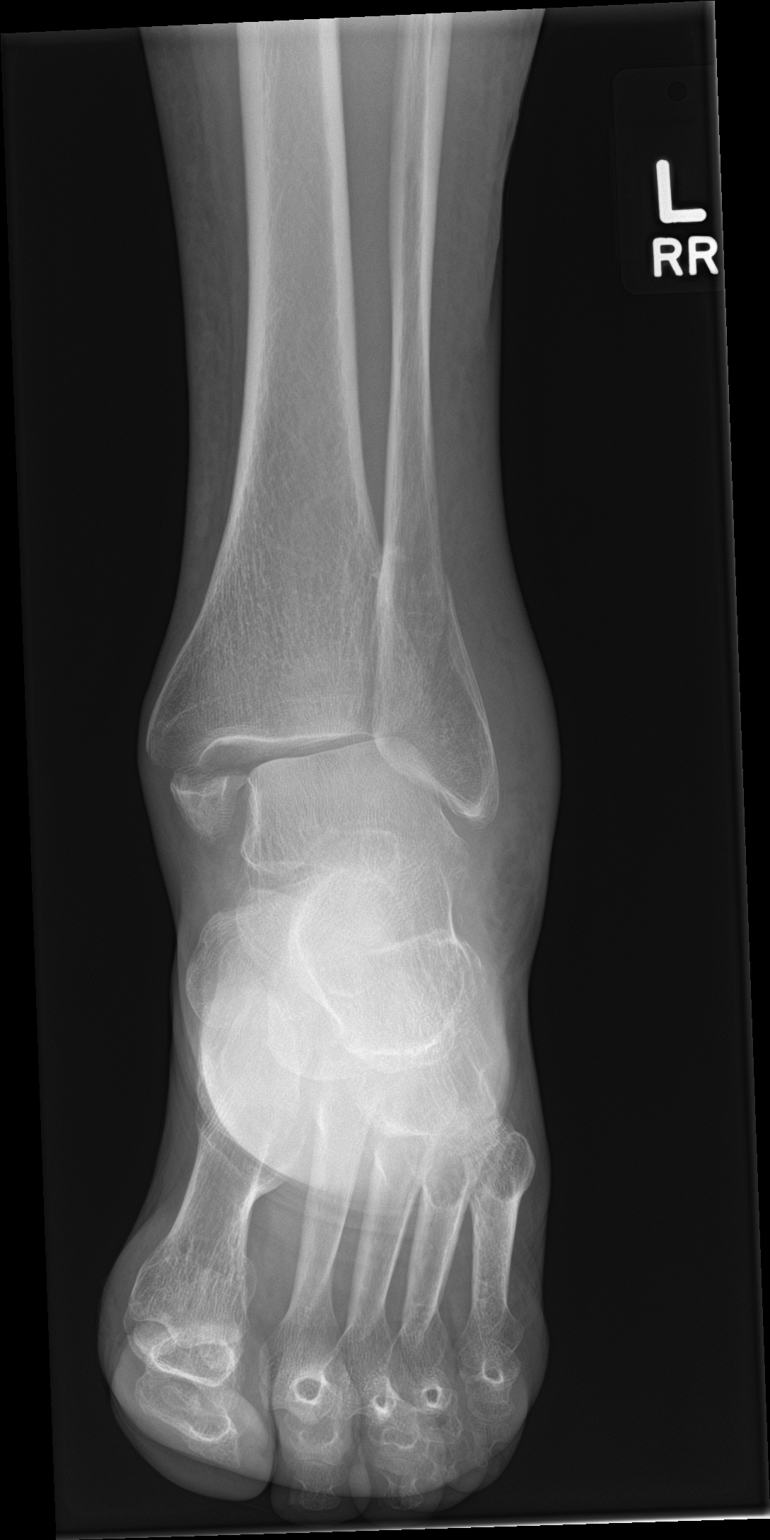

[ankle obl]
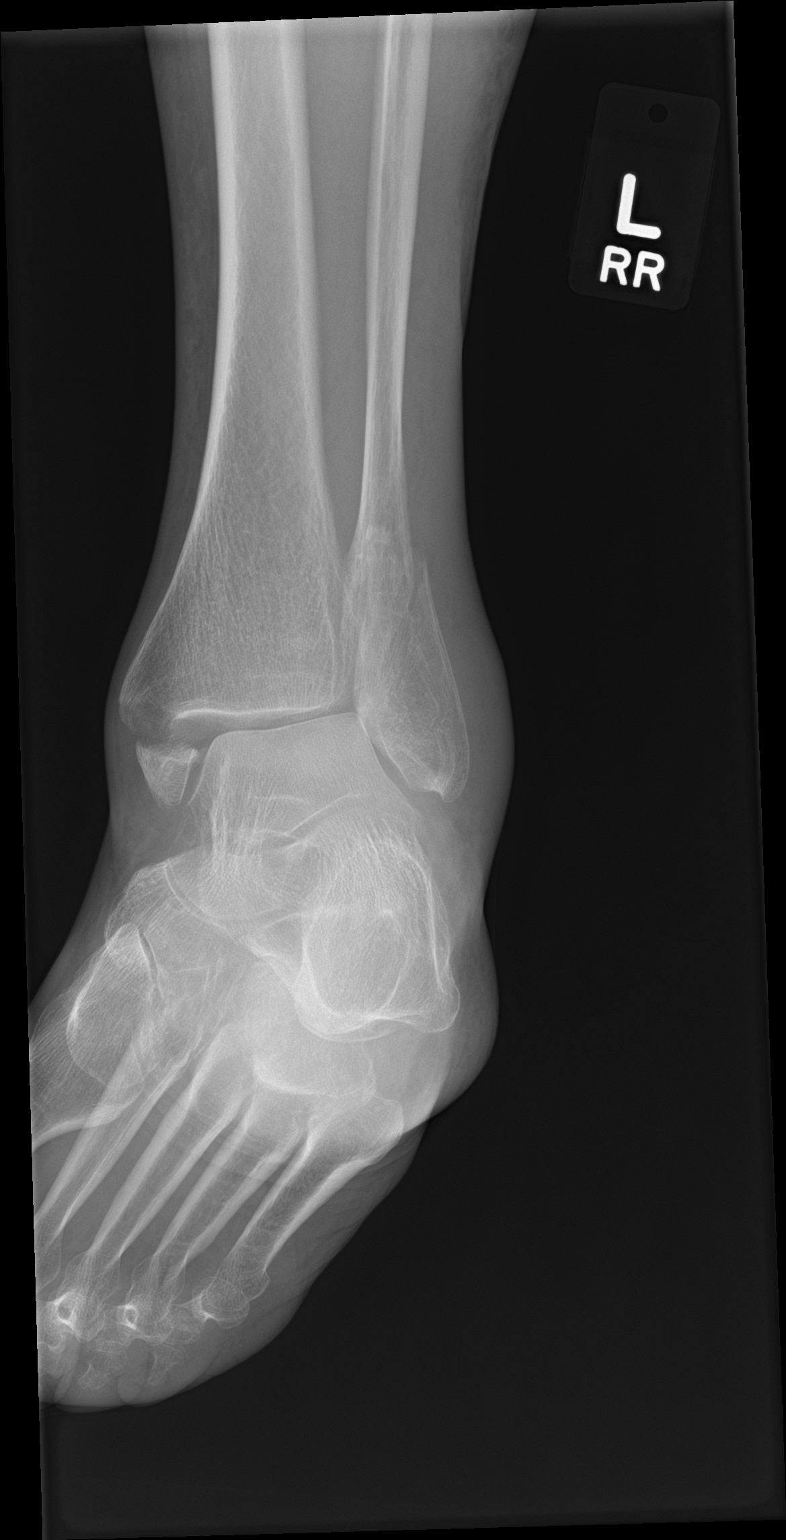

[ankle lat]
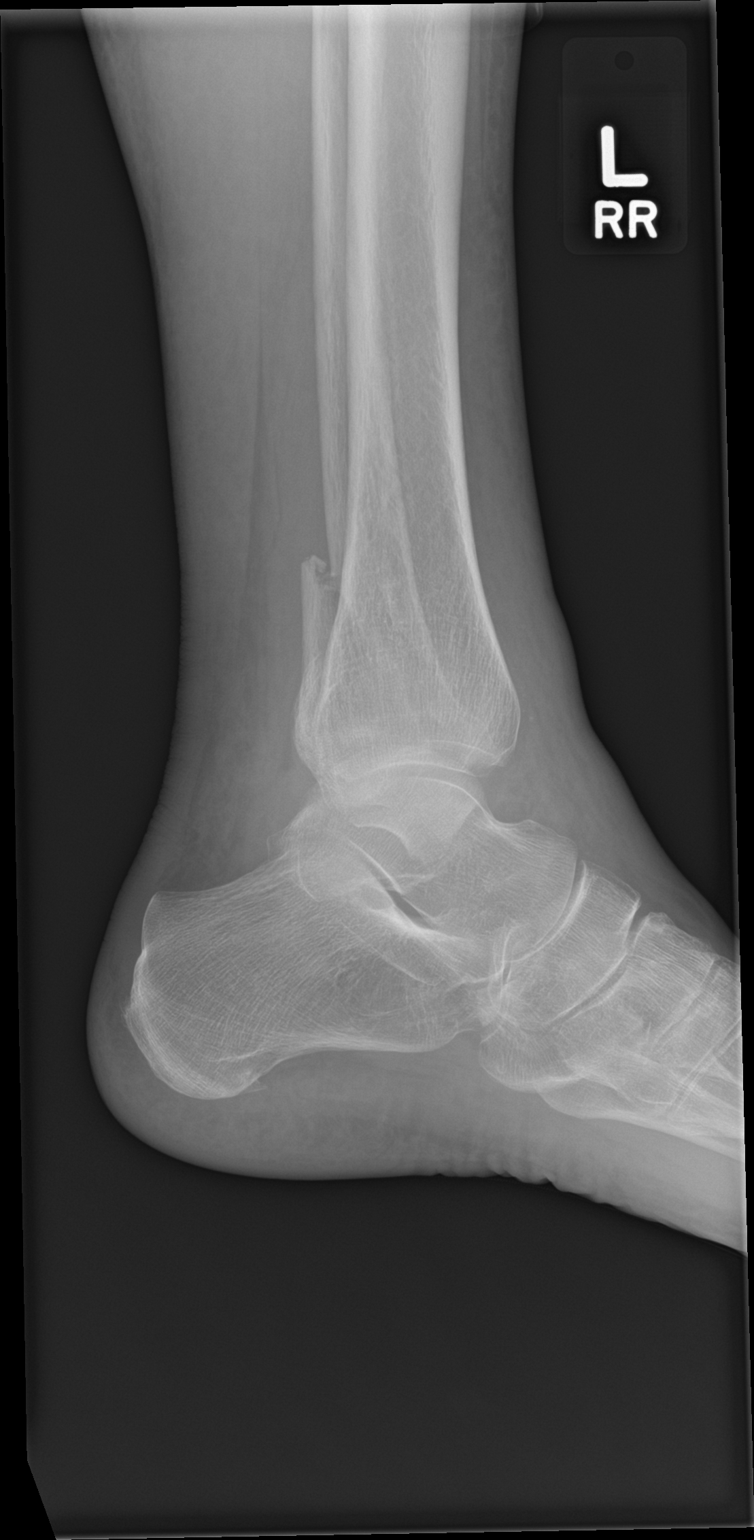

[3 of 3 positions shown; findings below may reference images not displayed]

FINDINGS: Osteopenia. There is a bimalleolar fracture with an oblique fracture
through the distal fibula with mild posterior displacement of the
distal fragment by approximately 1 cortex width. There is a mildly
displaced medial malleolar fracture with lateral translocation of
the distal fragment by approximately 6 mm. Ankle mortise is
disrupted with widening of the medial clear space and lateral tilt
of the talar dome. There is associated soft tissue edema.
IMPRESSION: Unstable bimalleolar fracture.

## 2022-01-07 IMAGING — RF DG ANKLE COMPLETE 3+V*L*
1 series · 3 of 3 positions shown · non-contrast
Comparison: Preoperative radiographs 03/01/2020

CLINICAL DATA: Left ankle fracture ORIF.

EXAM:
LEFT ANKLE COMPLETE - 3+ VIEW; DG C-ARM 1-60 MIN

[Series 1: run · 3 of 3 slices shown]
[im 1/3]
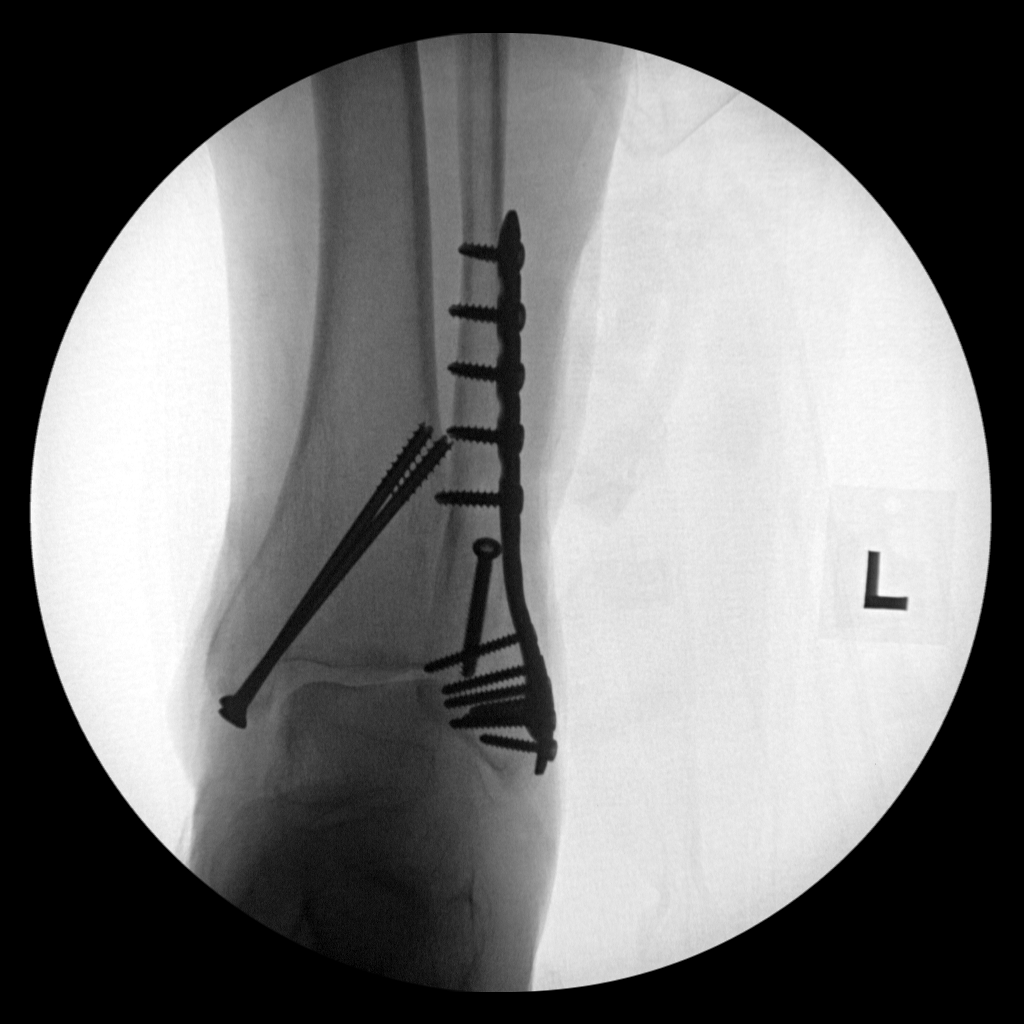
[im 2/3]
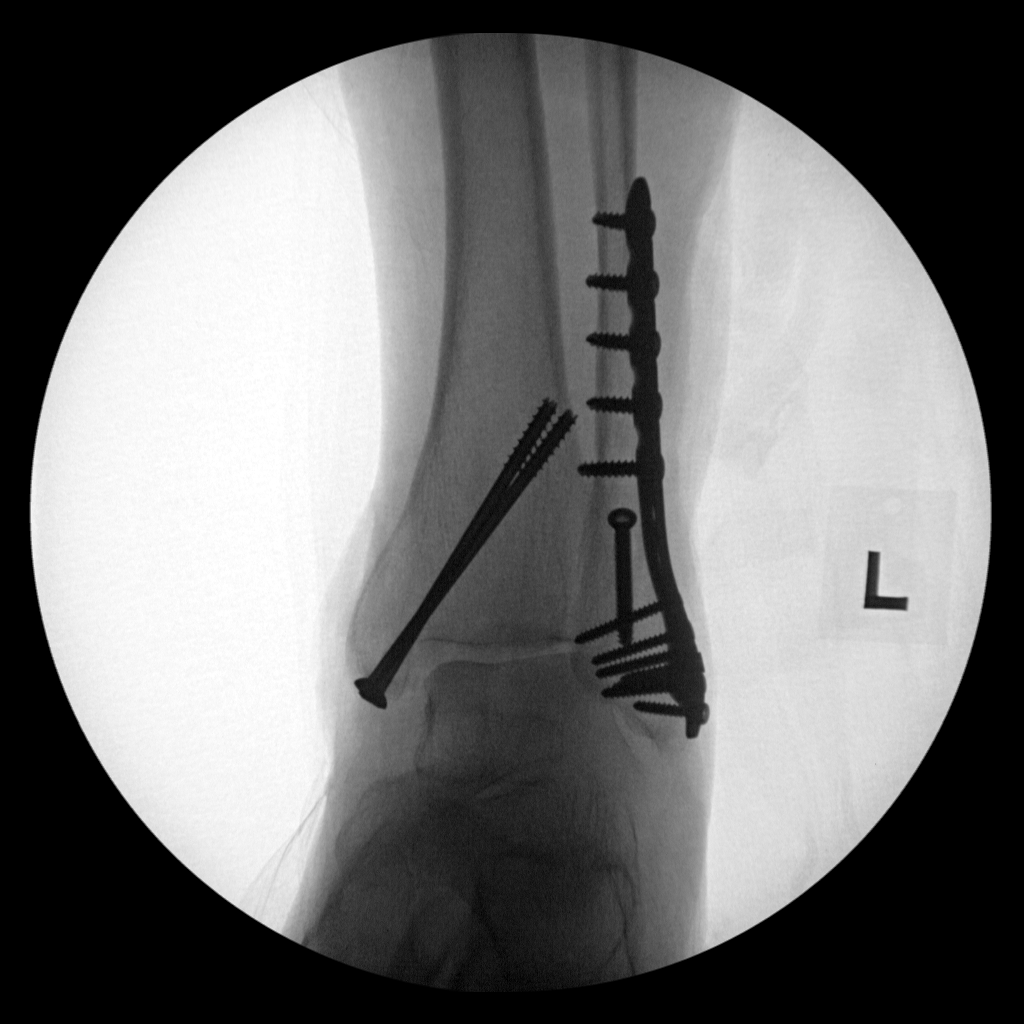
[im 3/3]
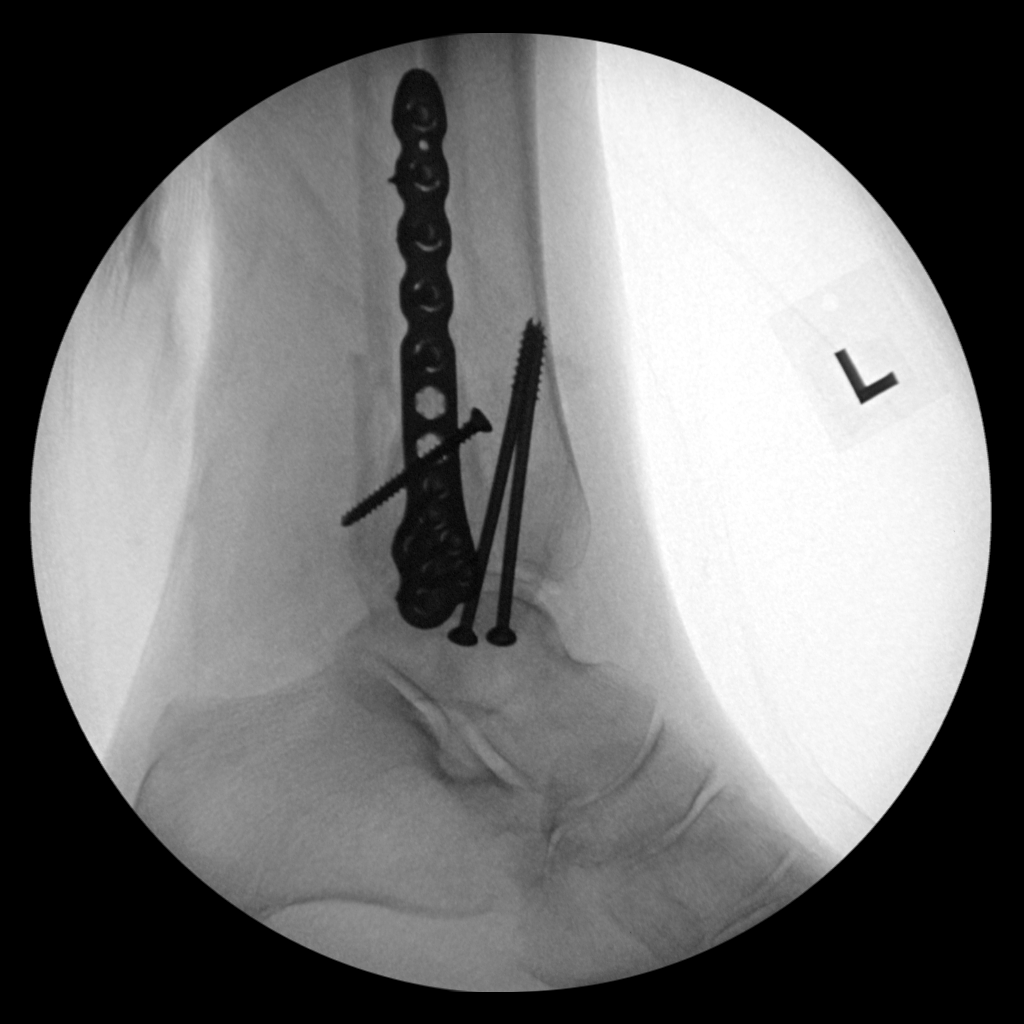

[3 of 3 positions shown; findings below may reference images not displayed]

FINDINGS: Three fluoroscopic spot views obtained in the operating room in
frontal, lateral, and oblique projections. Lateral plate and multi
screw fixation as well as inter fragmentary screw traverses the
distal fibula. Two screws traverse the medial malleolus. There is
improved ankle mortise alignment with residual talar tilt. Total
fluoroscopy time 46 seconds. Total dose 1.55 mGy.
IMPRESSION: Intraoperative fluoroscopy during ORIF of distal tibia and fibular
fractures.

## 2022-01-07 IMAGING — RF DG C-ARM 1-60 MIN
1 series · 3 of 3 positions shown · non-contrast
Comparison: Preoperative radiographs 03/01/2020

CLINICAL DATA: Left ankle fracture ORIF.

EXAM:
LEFT ANKLE COMPLETE - 3+ VIEW; DG C-ARM 1-60 MIN

[Series 1: run · 3 of 3 slices shown]
[im 1/3]
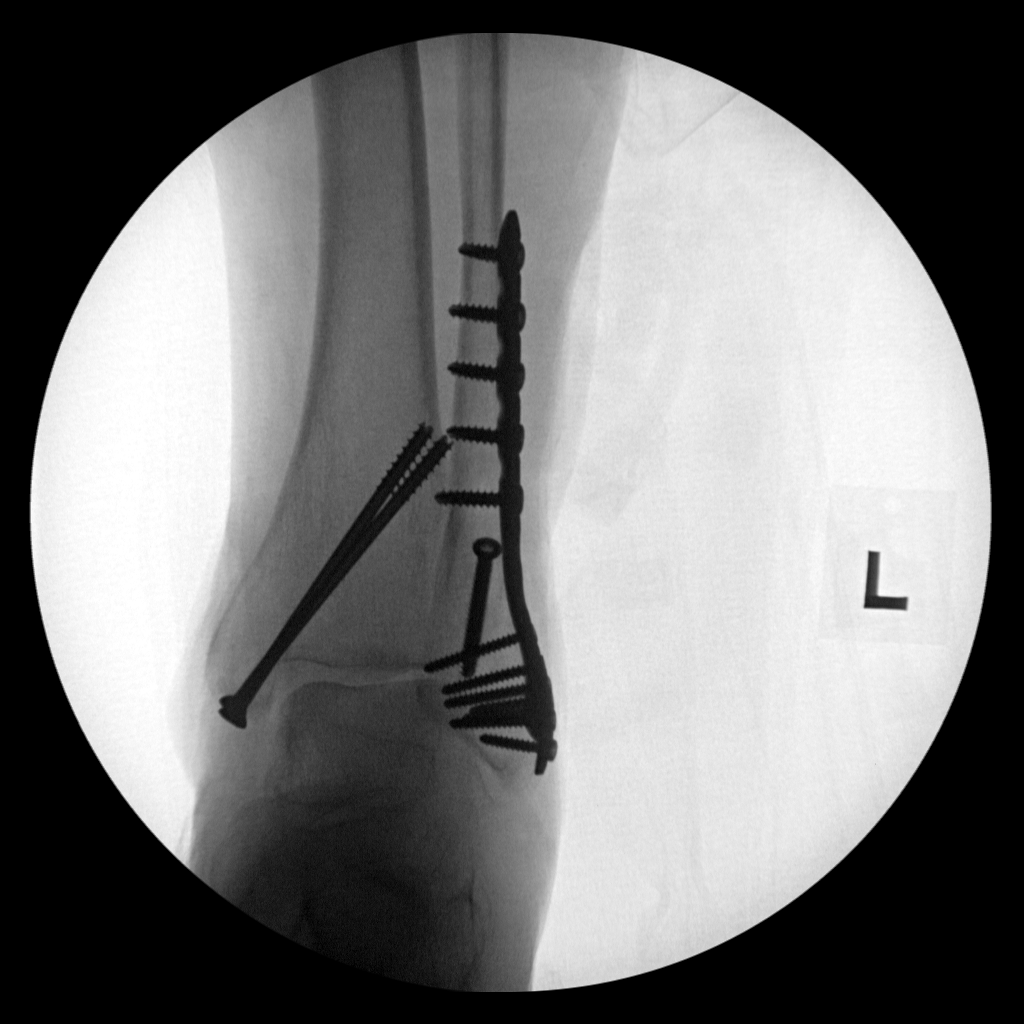
[im 2/3]
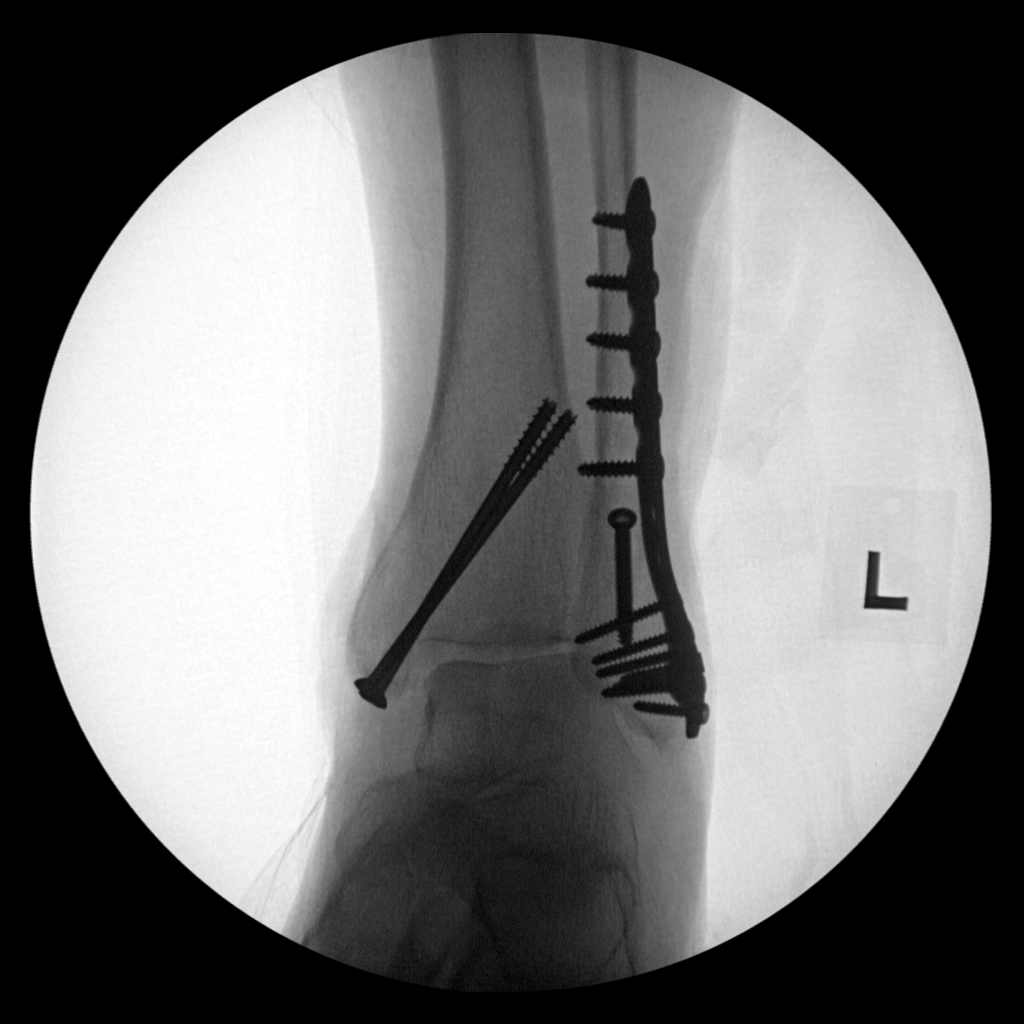
[im 3/3]
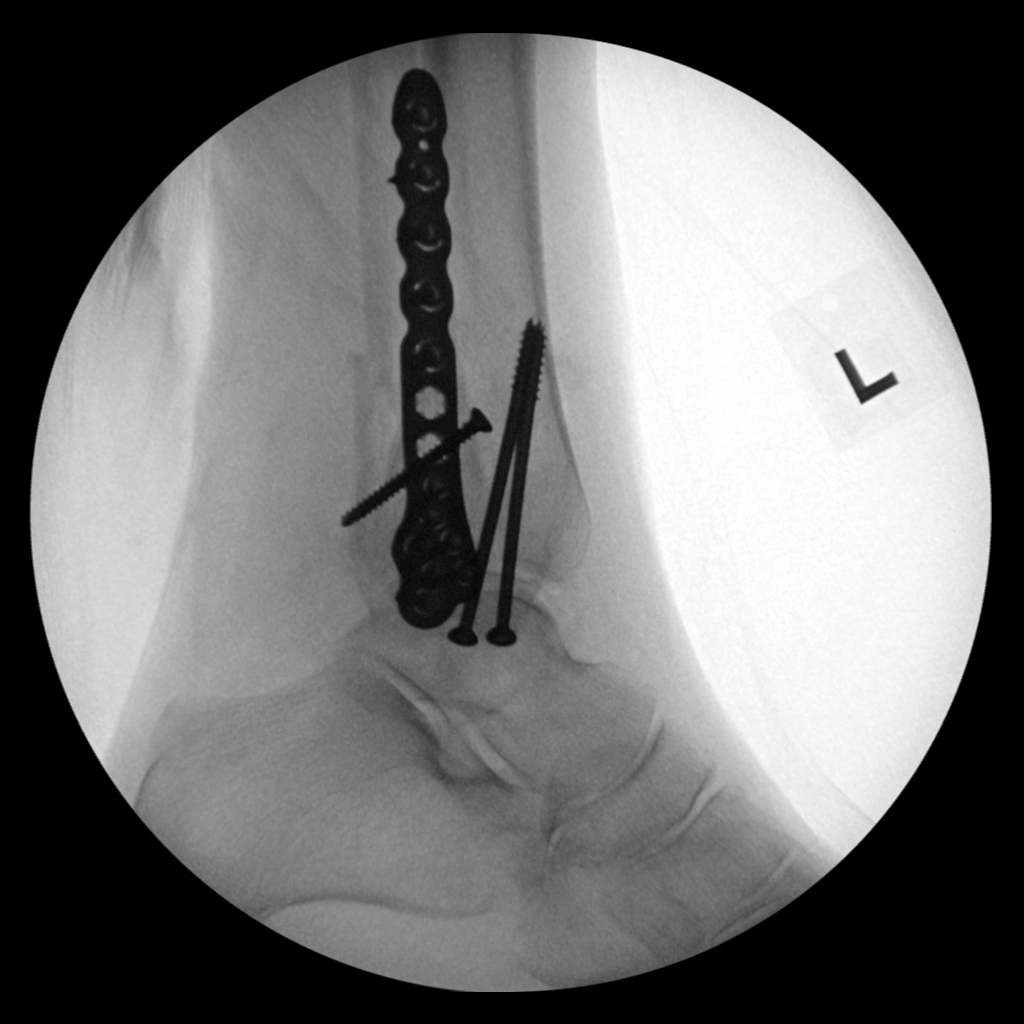

[3 of 3 positions shown; findings below may reference images not displayed]

FINDINGS: Three fluoroscopic spot views obtained in the operating room in
frontal, lateral, and oblique projections. Lateral plate and multi
screw fixation as well as inter fragmentary screw traverses the
distal fibula. Two screws traverse the medial malleolus. There is
improved ankle mortise alignment with residual talar tilt. Total
fluoroscopy time 46 seconds. Total dose 1.55 mGy.
IMPRESSION: Intraoperative fluoroscopy during ORIF of distal tibia and fibular
fractures.
